# Patient Record
Sex: Female | Born: 1980
Health system: Southern US, Community
[De-identification: ages and names within clinical notes are randomized; demographics above are authoritative.]

## PROBLEM LIST (undated history)

## (undated) DIAGNOSIS — O139 Gestational [pregnancy-induced] hypertension without significant proteinuria, unspecified trimester: Secondary | ICD-10-CM

## (undated) HISTORY — PX: INGUINAL HERNIA REPAIR: SUR1180

---

## 2002-11-17 ENCOUNTER — Encounter: Admission: RE | Admit: 2002-11-17 | Discharge: 2002-11-17 | Payer: Self-pay | Admitting: Family Medicine

## 2002-11-17 ENCOUNTER — Encounter: Payer: Self-pay | Admitting: Family Medicine

## 2007-01-31 ENCOUNTER — Emergency Department (HOSPITAL_COMMUNITY): Admission: EM | Admit: 2007-01-31 | Discharge: 2007-01-31 | Payer: Self-pay | Admitting: Emergency Medicine

## 2007-02-05 ENCOUNTER — Ambulatory Visit (HOSPITAL_COMMUNITY): Admission: RE | Admit: 2007-02-05 | Discharge: 2007-02-05 | Payer: Self-pay | Admitting: Obstetrics & Gynecology

## 2007-02-24 ENCOUNTER — Ambulatory Visit (HOSPITAL_COMMUNITY): Admission: RE | Admit: 2007-02-24 | Discharge: 2007-02-24 | Payer: Self-pay | Admitting: Obstetrics & Gynecology

## 2007-06-02 ENCOUNTER — Ambulatory Visit (HOSPITAL_COMMUNITY): Admission: RE | Admit: 2007-06-02 | Discharge: 2007-06-02 | Payer: Self-pay | Admitting: Obstetrics and Gynecology

## 2007-07-20 ENCOUNTER — Inpatient Hospital Stay (HOSPITAL_COMMUNITY): Admission: AD | Admit: 2007-07-20 | Discharge: 2007-07-20 | Payer: Self-pay | Admitting: Obstetrics & Gynecology

## 2007-07-23 ENCOUNTER — Inpatient Hospital Stay (HOSPITAL_COMMUNITY): Admission: AD | Admit: 2007-07-23 | Discharge: 2007-07-27 | Payer: Self-pay | Admitting: Obstetrics and Gynecology

## 2007-07-24 ENCOUNTER — Encounter (INDEPENDENT_AMBULATORY_CARE_PROVIDER_SITE_OTHER): Payer: Self-pay | Admitting: Obstetrics and Gynecology

## 2007-07-28 ENCOUNTER — Encounter: Admission: RE | Admit: 2007-07-28 | Discharge: 2007-07-29 | Payer: Self-pay | Admitting: Obstetrics and Gynecology

## 2011-03-26 NOTE — Op Note (Signed)
[name], [name]                ACCOUNT NO.:  [id number]   MEDICAL RECORD NO.:  [id number]          PATIENT TYPE:  INP   LOCATION:  9106                          FACILITY:  [facility]   PHYSICIAN:  Sherron Monday, MD        DATE OF BIRTH:  [birth year]   DATE OF PROCEDURE:  07/24/2007  DATE OF DISCHARGE:                               OPERATIVE REPORT   PREOPERATIVE DIAGNOSIS:  Intrauterine pregnancy at term, pregnancy  induced hypertension, fetal intolerance of labor, likely intrauterine  growth retardation.   POSTOPERATIVE DIAGNOSIS:  Intrauterine pregnancy at term, pregnancy  induced hypertension, fetal intolerance of labor, likely intrauterine  growth retardation,  delivered.   PROCEDURE:  Primary low transverse cesarean section.   ANESTHESIA:  Spinal.   SURGEON:  Sherron Monday, MD.   FINDINGS:  Viable female infant, and 6:07 a.m. with Apgar's of 9 at 1  minute and 9 at 5 minutes and a weight of 5 pounds 1 ounce normal  uterus, tubes and ovaries.   ESTIMATED BLOOD LOSS:  600 mL.   IV FLUIDS:  2400 mL.   URINE OUTPUT:  250 mL clear urine at the end of the procedure.   COMPLICATIONS:  None.  Placenta to cord blood collection and then  pathology.   DISPOSITION FOLLOWING PROCEDURE:  Stable to the OR.   DESCRIPTION OF PROCEDURE:  After informed consent was reviewed with the  patient including risks, benefits and alternatives of the surgical  procedure including but not limited to bleeding, infection, damage to  the surrounding organs, bowels, bladder, ureters, blood vessels, etc. as  well as trouble healing, she was transported to the OR in stable  condition.  Spinal anesthesia was induced and found to be adequate.  She  was then prepped and draped in the normal sterile fashion and a Foley  catheter was sterilely placed. A Pfannenstiel skin incision was made  approximately 2 fingerbreadths above the pubic symphysis and carried  through to the underlying layer of fascia sharply. The  fascia was  incised in the midline and the incision was extended laterally with Mayo  scissors.   The inferior aspect of the fascial incision was grasped with Kocher  clamps, elevated and the rectus muscles were dissected off both bluntly  and sharply.  Attention was then turned to the superior aspect of the  fascial incision which was grasped with clamps, elevated and the rectus  muscles were dissected off both bluntly and sharply and the midline was  easily identified and the peritoneum was entered bluntly.  The entry was  extended with direct visualization. The Alexis wound retractor was  placed after checking that bowels were not entrapped. The vesicouterine  peritoneum was easily identified and elevated with smooth pick-ups,  bladder flap was created with Metzenbaum scissors and blunt dissection.  The uterus was incised in a transverse fashion and this incision was  extended with bandage scissors.  The infant was delivered from a vertex  presentation.  Nose and mouth were suctioned on the field, cord was  clamped and cut, infant was handed off to the  waiting NICU staff.  The  uterus was cleared of all clots and debris.  The uterine incision was  closed in two layers, the first of which was a running locked, the  second is an imbricating layer. This was found to be hemostatic.  The  peritoneal cavity was irrigated and the uterus was replaced. The fascia  was closed with #0 Vicryl in a running locked fashion after the  subfascial planes had been inspected and found to be hemostatic.  The  subcuticular adipose layer was made hemostatic with Bovie cautery and  irrigated. Three interrupted's of plain gut were placed and the skin was  closed with staples.  The patient tolerated the procedure well.  Sponge,  lap and needle count was correct x2 at the end of the procedure per the  operating room staff.      Sherron Monday, MD  Electronically Signed     JB/MEDQ  D:  07/24/2007  T:   07/24/2007  Job:  [id number]

## 2011-03-26 NOTE — Discharge Summary (Signed)
[name], [name]                ACCOUNT NO.:  [id number]   MEDICAL RECORD NO.:  [id number]          PATIENT TYPE:  INP   LOCATION:  9106                          FACILITY:  [facility]   PHYSICIAN:  Sherron Monday, MD        DATE OF BIRTH:  11/12/1980   DATE OF ADMISSION:  07/23/2007  DATE OF DISCHARGE:  07/27/2007                               DISCHARGE SUMMARY   ADMISSION DIAGNOSIS:  Intrauterine pregnancy at term, nonreassuring NST  at the office with positive contraction stress test, for induction of  labor.   DISCHARGE DIAGNOSIS:  Intrauterine pregnancy at term, nonreassuring NST  at the office with positive contraction stress test, for induction of  labor, delivered via low transverse cesarean section.   HISTORY OF PRESENT ILLNESS:  A 30 year old, G2, P0-0-1-0, at 39 weeks  plus 5 weeks with pregnancy-induced hypertension, for contraction stress  test secondary to a nonreassuring NST in the office with a late/variable  decel.  Patient with negative CST initially, then spontaneous positive  CST, for induction of labor secondary to unfavorable cervix which was  fingertip, 20 and minus 3.  The choice was made to induce her with  Cervidil.  She stated she had good fetal movement, no loss of fluid, no  vaginal bleeding, occasional contraction.  She has lost her mucous plug.  Prenatal care was complicated by transfer of care, as well as a  chlamydial infection; right upper quadrant pain with negative right  upper quadrant ultrasound; ultrasound recently for size less than dates;  and a 24-hour urine July 22, 2007, which was negative for  preeclampsia and her PIH labs were negative.   PAST MEDICAL HISTORY:  Not significant.   PAST SURGICAL HISTORY:  Significant for hernia repair as an infant.   PAST OB/GYN HISTORY:  G1 was a therapeutic abortion.  G2 is the present  pregnancy.  History of an abnormal Pap smear, but they have been normal  since that.  History of Chlamydia times 2, as  well as Trichomonas.   MEDICATIONS:  Include prenatal vitamins.   ALLERGIES:  No known drug allergies.   SOCIAL HISTORY:  She has a history of tobacco use, however, in the  pregnancy, no alcohol, tobacco or drug use.  She is in  a serious  relationship.   FAMILY HISTORY:  Significant for chronic hypertension in mother, father  and paternal grandmother; diabetes in maternal grandmother and paternal  grandmother; and thyroid disease in maternal grandmother.   PRENATAL LABORATORIES:  Gonorrhea negative.  Chlamydia positive with a  test of cure that was negative.  Negative group B strep. Hemoglobin  11.3, platelets 222,000.  A positive.  Antibody screen negative.  RPR  nonreactive.  Hepatitis B surface antigen negative.  Rubella immune.  Normal hemoglobin electrophoresis.  Varicella immune.  One-hour GTT of  95.  Ultrasound performed on February 24, 2007, gave an [facility] of September  13, revealing normal anatomy, right lateral placenta -- this was  performed at 15 weeks.  Repeat ultrasound on the 12th of August for size  less than dates confirmed the  EDC of July 25, 2007, giving an  estimated fetal weight of 4 pounds 13 ounces, normal AFI, vertex and  normal anatomy.   PHYSICAL EXAMINATION:  VITAL SIGNS:  On admission, blood pressure was  141 to 152/ 89 to 97.  GENERAL:  In no apparent distress.  CARDIOVASCULAR:  Regular rate and rhythm.  LUNGS:  Clear to auscultation bilaterally.  ABDOMEN:  Soft, with a nontender fundus.  EXTREMITIES:  Symmetric and nontender.  PELVIC:  Vaginal exam fingertip, 20 and minus 3.  Fetal heart tones  150s, reactive, with occasional decel, overall reassuring with irregular  tocometry.   HOSPITAL COURSE:  The decision was made to proceed with induction of  labor with Cervidil secondary to her positive contraction stress test.  Throughout the night she was monitored.  Cervidil was placed at 1946 and  heart tones at approximately 10 o'clock in the evening  were 140s and  150s and reactive with no contraction.  The patient complained of  occasional cramping.  At 4:50 in the morning on 11 September after she  had had runs of spontaneous and late decelerations, discussed with the  patient the risks, benefits and alternatives of continuing the induction  of labor with fetal intolerance of labor and a likely small infant, with  pregnancy-induced hypertension and fetal growth restriction.  We  discussed the risks, benefits and alternatives of the cesarean section,  including bleeding, infection, damage to surrounding organs, including  but not limited to bowels, bladder, ureters and blood vessels, also,  trouble healing.  Questions from the patient and the father of the baby  were answered; and the decision was made to proceed with a low  transverse cesarean section.  This went without complication.  EBL of  approximately 600 cc.  Viable female infant was delivered at 6:07 on 12  September with Apgars of 9 and 9 and weight of 5 pounds 1 ounce.  Normal  uterus, tubes and ovaries were noted.  Her postoperative course was  relatively uncomplicated.  She remained afebrile throughout.  Her  hemoglobin decreased from 11.9 to 9.1.  She was able to ambulate, had  normal lochia, pain controlled.  She was passing gas and tolerating a  regular diet on day of discharge.  She was discharged to home with  routine discharge instructions and numbers to call with any questions or  problems.  She will follow up in our office in approximately 2 weeks for  an incision check.  She will be discharged with a prescription for  Motrin, Vicodin and prenatal vitamins.   DISCHARGE INFORMATION:  She is breast and bottle feeding.  A positive.  Rubella immune.  She would like to use the IUD for contraception and  this will be placed at her postpartum checkup.  Her hemoglobin decreased  from 11.9 to 9.1.      Sherron Monday, MD  Electronically Signed     JB/MEDQ  D:   07/27/2007  T:  07/27/2007  Job:  [id number]

## 2011-08-23 LAB — CBC
HCT: 26 — ABNORMAL LOW
HCT: 34 — ABNORMAL LOW
Hemoglobin: 11.9 — ABNORMAL LOW
Hemoglobin: 9.1 — ABNORMAL LOW
MCHC: 35.1
MCV: 89.7
RBC: 3.79 — ABNORMAL LOW
RDW: 13.5
WBC: 9.9

## 2012-06-12 ENCOUNTER — Encounter (HOSPITAL_COMMUNITY): Payer: Self-pay

## 2012-06-12 ENCOUNTER — Inpatient Hospital Stay (HOSPITAL_COMMUNITY): Payer: BC Managed Care – PPO

## 2012-06-12 ENCOUNTER — Inpatient Hospital Stay (HOSPITAL_COMMUNITY)
Admission: AD | Admit: 2012-06-12 | Discharge: 2012-06-12 | Disposition: A | Discharge status: Home / Self Care | Payer: BC Managed Care – PPO | Source: Ambulatory Visit | Attending: Obstetrics and Gynecology | Admitting: Obstetrics and Gynecology

## 2012-06-12 DIAGNOSIS — O200 Threatened abortion: Secondary | ICD-10-CM | POA: Insufficient documentation

## 2012-06-12 DIAGNOSIS — O039 Complete or unspecified spontaneous abortion without complication: Secondary | ICD-10-CM

## 2012-06-12 HISTORY — DX: Gestational (pregnancy-induced) hypertension without significant proteinuria, unspecified trimester: O13.9

## 2012-06-12 LAB — URINE MICROSCOPIC-ADD ON

## 2012-06-12 LAB — URINALYSIS, ROUTINE W REFLEX MICROSCOPIC
Bilirubin Urine: NEGATIVE
Specific Gravity, Urine: >1.03 — ABNORMAL HIGH (ref 1.005–1.030)
Urobilinogen, UA: 0.2 mg/dL (ref 0.0–1.0)

## 2012-06-12 LAB — HCG, QUANTITATIVE, PREGNANCY: hCG, Beta Chain, Quant, S: 10 m[IU]/mL — ABNORMAL HIGH (ref <5)

## 2012-06-12 NOTE — MAU Provider Note (Signed)
  History     CSN: [id number]  Arrival date and time: 06/12/12 0757   First Provider Initiated Contact with Patient 06/12/12 0932      Chief Complaint  Patient presents with  . Vaginal Bleeding  . Abdominal Pain   HPI [name] 31 y.o. [redacted]w[redacted]d   Comes to MAU with vaginal bleeding today.  No pain at present.   OB History    Grav Para Term Preterm Abortions TAB SAB Ect Mult Living   3 1 1  1 1    1       Past Medical History  Diagnosis Date  . Gestational hypertension     Past Surgical History  Procedure Date  . Cesarean section   . Inguinal hernia repair     Family History  Problem Relation Age of Onset  . Other Neg Hx     History  Substance Use Topics  . Smoking status: Never Smoker   . Smokeless tobacco: Not on file  . Alcohol Use: No    Allergies: No Known Allergies  Prescriptions prior to admission  Medication Sig Dispense Refill  . acetaminophen (TYLENOL) 500 MG tablet Take 500 mg by mouth every 6 (six) hours as needed. For headache.      . folic acid (FOLVITE) 800 MCG tablet Take 400 mcg by mouth daily.      . Prenatal Vit-Fe Fumarate-FA (PRENATAL MULTIVITAMIN) TABS Take 1 tablet by mouth daily.        Review of Systems  Gastrointestinal: Negative for nausea, vomiting and abdominal pain.  Genitourinary:       Vaginal bleeding   Physical Exam   Blood pressure 142/95, pulse 70, temperature 98.6 F (37 C), temperature source Oral, resp. rate 16, height 5' 4.5" (1.638 m), weight 184 lb 9.6 oz (83.734 kg), last menstrual period 04/29/2012, SpO2 98.00%.  Physical Exam  Nursing note and vitals reviewed. Constitutional: She is oriented to person, place, and time. She appears well-developed and well-nourished.  HENT:  Head: Normocephalic.  Eyes: EOM are normal.  Neck: Neck supple.  Musculoskeletal: Normal range of motion.  Neurological: She is alert and oriented to person, place, and time.  Skin: Skin is warm and dry.  Psychiatric: She has a  normal mood and affect.    MAU Course  Procedures Results for orders placed during the hospital encounter of 06/12/12 (from the past 24 hour(s))  ABO/RH     Status: Normal (Preliminary result)   Collection Time   06/12/12  9:30 AM      Component Value Range   ABO/RH(D) A POS    HCG, QUANTITATIVE, PREGNANCY     Status: Abnormal   Collection Time   06/12/12  9:30 AM      Component Value Range   hCG, Beta Chain, Quant, S 10 (*) <5 mIU/mL   MDM   1120 - Consult with Dr. Ellyn Hack re: plan of care  Assessment and Plan  Probable miscarriage  Plan Follow up in the office in one week for repeat blood work.  [name] 06/12/2012, 11:19 AM

## 2012-06-12 NOTE — MAU Note (Signed)
Patient states she had brown spotting a couple of days ago. This morning had bright red bleeding with a little cramping. Patient has on a pad with a small amount of dark red blood.

## 2012-06-12 NOTE — ED Notes (Signed)
T.Burleson,NP  Discussed results. And miscarriage with pt and  Husband. Pt crying and upset. Allowed her to verbalized feelings. Husband supportive. Heart strings info and memory pillow given.

## 2012-11-18 ENCOUNTER — Other Ambulatory Visit (HOSPITAL_COMMUNITY): Payer: Self-pay | Admitting: Obstetrics and Gynecology

## 2012-11-18 ENCOUNTER — Encounter (HOSPITAL_COMMUNITY): Payer: Self-pay

## 2012-11-18 ENCOUNTER — Ambulatory Visit (HOSPITAL_COMMUNITY)
Admission: RE | Admit: 2012-11-18 | Discharge: 2012-11-18 | Disposition: A | Discharge status: Home / Self Care | Payer: BC Managed Care – PPO | Source: Ambulatory Visit | Attending: Obstetrics and Gynecology | Admitting: Obstetrics and Gynecology

## 2012-11-18 DIAGNOSIS — Z3689 Encounter for other specified antenatal screening: Secondary | ICD-10-CM

## 2012-11-18 DIAGNOSIS — O283 Abnormal ultrasonic finding on antenatal screening of mother: Secondary | ICD-10-CM

## 2012-11-18 DIAGNOSIS — O358XX0 Maternal care for other (suspected) fetal abnormality and damage, not applicable or unspecified: Secondary | ICD-10-CM | POA: Insufficient documentation

## 2012-11-18 DIAGNOSIS — O34219 Maternal care for unspecified type scar from previous cesarean delivery: Secondary | ICD-10-CM | POA: Insufficient documentation

## 2012-11-18 DIAGNOSIS — O09299 Supervision of pregnancy with other poor reproductive or obstetric history, unspecified trimester: Secondary | ICD-10-CM | POA: Insufficient documentation

## 2012-11-18 DIAGNOSIS — Z1389 Encounter for screening for other disorder: Secondary | ICD-10-CM | POA: Insufficient documentation

## 2012-11-18 DIAGNOSIS — Z363 Encounter for antenatal screening for malformations: Secondary | ICD-10-CM | POA: Insufficient documentation

## 2012-11-19 ENCOUNTER — Other Ambulatory Visit (HOSPITAL_COMMUNITY): Payer: BC Managed Care – PPO

## 2012-11-20 ENCOUNTER — Other Ambulatory Visit: Payer: Self-pay

## 2013-01-07 ENCOUNTER — Other Ambulatory Visit (HOSPITAL_COMMUNITY): Payer: Self-pay | Admitting: Obstetrics and Gynecology

## 2013-01-07 DIAGNOSIS — O34219 Maternal care for unspecified type scar from previous cesarean delivery: Secondary | ICD-10-CM

## 2013-01-07 DIAGNOSIS — O358XX0 Maternal care for other (suspected) fetal abnormality and damage, not applicable or unspecified: Secondary | ICD-10-CM

## 2013-01-07 DIAGNOSIS — O09299 Supervision of pregnancy with other poor reproductive or obstetric history, unspecified trimester: Secondary | ICD-10-CM

## 2013-01-14 ENCOUNTER — Ambulatory Visit (HOSPITAL_COMMUNITY)
Admission: RE | Admit: 2013-01-14 | Discharge: 2013-01-14 | Disposition: A | Discharge status: Home / Self Care | Payer: BC Managed Care – PPO | Source: Ambulatory Visit | Attending: Obstetrics and Gynecology | Admitting: Obstetrics and Gynecology

## 2013-01-14 DIAGNOSIS — O09299 Supervision of pregnancy with other poor reproductive or obstetric history, unspecified trimester: Secondary | ICD-10-CM | POA: Insufficient documentation

## 2013-01-14 DIAGNOSIS — O34219 Maternal care for unspecified type scar from previous cesarean delivery: Secondary | ICD-10-CM | POA: Insufficient documentation

## 2013-01-14 DIAGNOSIS — O358XX0 Maternal care for other (suspected) fetal abnormality and damage, not applicable or unspecified: Secondary | ICD-10-CM | POA: Insufficient documentation

## 2013-02-09 ENCOUNTER — Other Ambulatory Visit (HOSPITAL_COMMUNITY): Payer: Self-pay | Admitting: Obstetrics and Gynecology

## 2013-02-09 DIAGNOSIS — O09299 Supervision of pregnancy with other poor reproductive or obstetric history, unspecified trimester: Secondary | ICD-10-CM

## 2013-02-09 DIAGNOSIS — O34219 Maternal care for unspecified type scar from previous cesarean delivery: Secondary | ICD-10-CM

## 2013-02-09 DIAGNOSIS — O358XX0 Maternal care for other (suspected) fetal abnormality and damage, not applicable or unspecified: Secondary | ICD-10-CM

## 2013-02-11 ENCOUNTER — Encounter (HOSPITAL_COMMUNITY): Payer: Self-pay

## 2013-02-11 ENCOUNTER — Ambulatory Visit (HOSPITAL_COMMUNITY)
Admission: RE | Admit: 2013-02-11 | Discharge: 2013-02-11 | Disposition: A | Discharge status: Home / Self Care | Payer: BC Managed Care – PPO | Source: Ambulatory Visit | Attending: Obstetrics and Gynecology | Admitting: Obstetrics and Gynecology

## 2013-02-11 VITALS — BP 132/80 | HR 78 | Wt 221.0 lb

## 2013-02-11 DIAGNOSIS — O358XX0 Maternal care for other (suspected) fetal abnormality and damage, not applicable or unspecified: Secondary | ICD-10-CM

## 2013-02-11 DIAGNOSIS — O34219 Maternal care for unspecified type scar from previous cesarean delivery: Secondary | ICD-10-CM | POA: Insufficient documentation

## 2013-02-11 DIAGNOSIS — O09299 Supervision of pregnancy with other poor reproductive or obstetric history, unspecified trimester: Secondary | ICD-10-CM

## 2013-03-11 ENCOUNTER — Encounter (HOSPITAL_COMMUNITY): Payer: Self-pay

## 2013-03-11 ENCOUNTER — Ambulatory Visit (HOSPITAL_COMMUNITY)
Admission: RE | Admit: 2013-03-11 | Discharge: 2013-03-11 | Disposition: A | Discharge status: Home / Self Care | Payer: BC Managed Care – PPO | Source: Ambulatory Visit | Attending: Obstetrics and Gynecology | Admitting: Obstetrics and Gynecology

## 2013-03-11 DIAGNOSIS — O358XX0 Maternal care for other (suspected) fetal abnormality and damage, not applicable or unspecified: Secondary | ICD-10-CM

## 2013-03-11 DIAGNOSIS — O09299 Supervision of pregnancy with other poor reproductive or obstetric history, unspecified trimester: Secondary | ICD-10-CM | POA: Insufficient documentation

## 2013-03-11 DIAGNOSIS — O34219 Maternal care for unspecified type scar from previous cesarean delivery: Secondary | ICD-10-CM | POA: Insufficient documentation

## 2013-09-14 ENCOUNTER — Other Ambulatory Visit: Payer: Self-pay

## 2013-09-23 ENCOUNTER — Encounter (HOSPITAL_COMMUNITY): Payer: Self-pay | Admitting: *Deleted

## 2013-11-19 ENCOUNTER — Other Ambulatory Visit: Payer: Self-pay | Admitting: Nurse Practitioner

## 2013-11-19 DIAGNOSIS — R51 Headache: Principal | ICD-10-CM

## 2013-11-19 DIAGNOSIS — R42 Dizziness and giddiness: Secondary | ICD-10-CM

## 2013-11-19 DIAGNOSIS — R519 Headache, unspecified: Secondary | ICD-10-CM

## 2013-11-30 ENCOUNTER — Other Ambulatory Visit: Payer: BC Managed Care – PPO

## 2013-12-03 ENCOUNTER — Ambulatory Visit
Admission: RE | Admit: 2013-12-03 | Discharge: 2013-12-03 | Disposition: A | Discharge status: Home / Self Care | Payer: BC Managed Care – PPO | Source: Ambulatory Visit | Attending: Nurse Practitioner | Admitting: Nurse Practitioner

## 2013-12-03 ENCOUNTER — Other Ambulatory Visit: Payer: Self-pay | Admitting: Nurse Practitioner

## 2013-12-03 DIAGNOSIS — R519 Headache, unspecified: Secondary | ICD-10-CM

## 2013-12-03 DIAGNOSIS — R51 Headache: Principal | ICD-10-CM

## 2013-12-03 DIAGNOSIS — R42 Dizziness and giddiness: Secondary | ICD-10-CM

## 2014-09-12 ENCOUNTER — Encounter (HOSPITAL_COMMUNITY): Payer: Self-pay | Admitting: *Deleted

## 2016-04-30 DIAGNOSIS — Z30431 Encounter for routine checking of intrauterine contraceptive device: Secondary | ICD-10-CM | POA: Diagnosis not present

## 2016-04-30 DIAGNOSIS — Z6832 Body mass index (BMI) 32.0-32.9, adult: Secondary | ICD-10-CM | POA: Diagnosis not present

## 2016-04-30 DIAGNOSIS — Z1389 Encounter for screening for other disorder: Secondary | ICD-10-CM | POA: Diagnosis not present

## 2016-04-30 DIAGNOSIS — Z01419 Encounter for gynecological examination (general) (routine) without abnormal findings: Secondary | ICD-10-CM | POA: Diagnosis not present

## 2016-04-30 DIAGNOSIS — Z113 Encounter for screening for infections with a predominantly sexual mode of transmission: Secondary | ICD-10-CM | POA: Diagnosis not present

## 2017-02-24 DIAGNOSIS — J309 Allergic rhinitis, unspecified: Secondary | ICD-10-CM | POA: Diagnosis not present

## 2017-02-24 DIAGNOSIS — R51 Headache: Secondary | ICD-10-CM | POA: Diagnosis not present

## 2017-05-08 DIAGNOSIS — Z0000 Encounter for general adult medical examination without abnormal findings: Secondary | ICD-10-CM | POA: Diagnosis not present

## 2017-05-08 DIAGNOSIS — R12 Heartburn: Secondary | ICD-10-CM | POA: Diagnosis not present

## 2017-05-08 DIAGNOSIS — R5383 Other fatigue: Secondary | ICD-10-CM | POA: Diagnosis not present

## 2017-05-08 DIAGNOSIS — E559 Vitamin D deficiency, unspecified: Secondary | ICD-10-CM | POA: Diagnosis not present

## 2017-06-20 DIAGNOSIS — Z713 Dietary counseling and surveillance: Secondary | ICD-10-CM | POA: Diagnosis not present

## 2017-07-31 DIAGNOSIS — Z1389 Encounter for screening for other disorder: Secondary | ICD-10-CM | POA: Diagnosis not present

## 2017-07-31 DIAGNOSIS — Z130 Encounter for screening for diseases of the blood and blood-forming organs and certain disorders involving the immune mechanism: Secondary | ICD-10-CM | POA: Diagnosis not present

## 2017-07-31 DIAGNOSIS — Z124 Encounter for screening for malignant neoplasm of cervix: Secondary | ICD-10-CM | POA: Diagnosis not present

## 2017-07-31 DIAGNOSIS — N898 Other specified noninflammatory disorders of vagina: Secondary | ICD-10-CM | POA: Diagnosis not present

## 2017-07-31 DIAGNOSIS — Z3202 Encounter for pregnancy test, result negative: Secondary | ICD-10-CM | POA: Diagnosis not present

## 2017-07-31 DIAGNOSIS — Z30431 Encounter for routine checking of intrauterine contraceptive device: Secondary | ICD-10-CM | POA: Diagnosis not present

## 2017-07-31 DIAGNOSIS — Z113 Encounter for screening for infections with a predominantly sexual mode of transmission: Secondary | ICD-10-CM | POA: Diagnosis not present

## 2017-07-31 DIAGNOSIS — Z01419 Encounter for gynecological examination (general) (routine) without abnormal findings: Secondary | ICD-10-CM | POA: Diagnosis not present

## 2017-07-31 DIAGNOSIS — Z6832 Body mass index (BMI) 32.0-32.9, adult: Secondary | ICD-10-CM | POA: Diagnosis not present

## 2017-08-01 DIAGNOSIS — Z124 Encounter for screening for malignant neoplasm of cervix: Secondary | ICD-10-CM | POA: Diagnosis not present

## 2017-08-04 DIAGNOSIS — Z713 Dietary counseling and surveillance: Secondary | ICD-10-CM | POA: Diagnosis not present

## 2017-09-08 DIAGNOSIS — Z713 Dietary counseling and surveillance: Secondary | ICD-10-CM | POA: Diagnosis not present

## 2017-09-24 DIAGNOSIS — Z713 Dietary counseling and surveillance: Secondary | ICD-10-CM | POA: Diagnosis not present

## 2017-10-06 DIAGNOSIS — Z713 Dietary counseling and surveillance: Secondary | ICD-10-CM | POA: Diagnosis not present

## 2017-12-02 DIAGNOSIS — Z713 Dietary counseling and surveillance: Secondary | ICD-10-CM | POA: Diagnosis not present

## 2017-12-22 DIAGNOSIS — Z713 Dietary counseling and surveillance: Secondary | ICD-10-CM | POA: Diagnosis not present

## 2018-01-19 DIAGNOSIS — Z713 Dietary counseling and surveillance: Secondary | ICD-10-CM | POA: Diagnosis not present

## 2018-02-03 DIAGNOSIS — Z713 Dietary counseling and surveillance: Secondary | ICD-10-CM | POA: Diagnosis not present

## 2018-03-16 DIAGNOSIS — Z713 Dietary counseling and surveillance: Secondary | ICD-10-CM | POA: Diagnosis not present

## 2018-03-24 ENCOUNTER — Ambulatory Visit (HOSPITAL_COMMUNITY)
Admission: EM | Admit: 2018-03-24 | Discharge: 2018-03-24 | Disposition: A | Discharge status: Home / Self Care | Payer: BLUE CROSS/BLUE SHIELD | Attending: Family Medicine | Admitting: Family Medicine

## 2018-03-24 ENCOUNTER — Encounter (HOSPITAL_COMMUNITY): Payer: Self-pay | Admitting: Family Medicine

## 2018-03-24 DIAGNOSIS — J3489 Other specified disorders of nose and nasal sinuses: Secondary | ICD-10-CM

## 2018-03-24 DIAGNOSIS — M546 Pain in thoracic spine: Secondary | ICD-10-CM

## 2018-03-24 MED ORDER — CETIRIZINE HCL 10 MG PO TABS: 10.0000 mg | ORAL_TABLET | Freq: Every day | ORAL | 0 refills | Status: DC

## 2018-03-24 MED ORDER — TRIAMCINOLONE ACETONIDE 55 MCG/ACT NA AERO: 2.0000 | INHALATION_SPRAY | Freq: Every day | NASAL | 12 refills | Status: DC

## 2018-03-24 MED ORDER — PREDNISONE 20 MG PO TABS: 40.0000 mg | ORAL_TABLET | Freq: Every day | ORAL | 0 refills | Status: AC

## 2018-03-24 MED ORDER — MELOXICAM 7.5 MG PO TABS: 7.5000 mg | ORAL_TABLET | Freq: Every day | ORAL | 0 refills | Status: DC

## 2018-03-24 NOTE — ED Provider Notes (Signed)
[facility]    CSN: [id number] Arrival date & time: 03/24/18  1308     History   Chief Complaint Chief Complaint  Patient presents with  . Facial Pain    HPI [name] is a 37 y.o. female.   37 year old female comes in for multiple complaints.   3 day history of sinus pressure and eye watering/itching. Denies rhinorrhea, nasal congestion. Denies eye redness, photophobia, foreign body sensation. Denies cough, sore throat. Denies fever, chills, night sweats. Blurry vision when eye watering. Glasses use without contact lens use. Benadryl and sudafed with mild relief.  1 day history of mid thoracic back pain. States no pain at rest, but exacerbated with movement. Denies injury/trauma. Went to a spin class yesterday. States pain started on the left side, now more midline. Denies urinary symptoms such as frequency, dysuria, hematuria. Denies numbness/tingling, loss of bladder or bowel control.  Aleve with improvement.      Past Medical History:  Diagnosis Date  . Gestational hypertension     There are no active problems to display for this patient.   Past Surgical History:  Procedure Laterality Date  . CESAREAN SECTION    . INGUINAL HERNIA REPAIR      OB History    Gravida  4   Para  1   Term  1   Preterm      AB  2   Living  1     SAB  1   TAB  1   Ectopic      Multiple      Live Births               Home Medications    Prior to Admission medications   Medication Sig Start Date End Date Taking? Authorizing Provider  acetaminophen (TYLENOL) 500 MG tablet Take 500 mg by mouth every 6 (six) hours as needed. For headache.    [provider]  cetirizine (ZYRTEC) 10 MG tablet Take 1 tablet (10 mg total) by mouth daily. 03/24/18   Belinda Fisher, PA-C  folic acid (FOLVITE) 800 MCG tablet Take 400 mcg by mouth daily.    [provider]  meloxicam (MOBIC) 7.5 MG tablet Take 1 tablet (7.5 mg total) by mouth daily. 03/24/18    Cathie Hoops, Annarae Macnair V, PA-C  predniSONE (DELTASONE) 20 MG tablet Take 2 tablets (40 mg total) by mouth daily for 5 days. 03/24/18 03/29/18  Belinda Fisher, PA-C  Prenatal Vit-Fe Fumarate-FA (PRENATAL MULTIVITAMIN) TABS Take 1 tablet by mouth daily.    [provider]  triamcinolone (NASACORT) 55 MCG/ACT AERO nasal inhaler Place 2 sprays into the nose daily. 03/24/18   Belinda Fisher, PA-C    Family History Family History  Problem Relation Age of Onset  . Other Neg Hx     Social History Social History   Tobacco Use  . Smoking status: Never Smoker  Substance Use Topics  . Alcohol use: No  . Drug use: No     Allergies   Patient has no known allergies.   Review of Systems Review of Systems  Reason unable to perform ROS: See HPI as above.     Physical Exam Triage Vital Signs ED Triage Vitals  Enc Vitals Group     BP 03/24/18 1327 (!) 148/95     Pulse Rate 03/24/18 1327 65     Resp 03/24/18 1327 16     Temp 03/24/18 1327 98 F (36.7 C)  Temp Source 03/24/18 1327 Oral     SpO2 03/24/18 1327 99 %     Weight 03/24/18 1332 183 lb (83 kg)     Height --      Head Circumference --      Peak Flow --      Pain Score 03/24/18 1331 6     Pain Loc --      Pain Edu? --      Excl. in GC? --    No data found.  Updated Vital Signs BP (!) 148/95 (BP Location: Right Arm)   Pulse 65   Temp 98 F (36.7 C) (Oral)   Resp 16   Wt 183 lb (83 kg)   SpO2 99%   BMI 30.93 kg/m   Physical Exam  Constitutional: She is oriented to person, place, and time. She appears well-developed and well-nourished. No distress.  HENT:  Head: Normocephalic and atraumatic.  Right Ear: Tympanic membrane, external ear and ear canal normal. Tympanic membrane is not erythematous and not bulging.  Left Ear: Tympanic membrane, external ear and ear canal normal. Tympanic membrane is not erythematous and not bulging.  Nose: Mucosal edema present. Right sinus exhibits maxillary sinus tenderness and frontal sinus  tenderness. Left sinus exhibits maxillary sinus tenderness and frontal sinus tenderness.  Mouth/Throat: Uvula is midline, oropharynx is clear and moist and mucous membranes are normal.  Eyes: Pupils are equal, round, and reactive to light. Conjunctivae, EOM and lids are normal.  Neck: Normal range of motion. Neck supple.  Cardiovascular: Normal rate, regular rhythm and normal heart sounds. Exam reveals no gallop and no friction rub.  No murmur heard. Pulmonary/Chest: Effort normal and breath sounds normal. No stridor. No respiratory distress. She has no decreased breath sounds. She has no wheezes. She has no rhonchi. She has no rales.  Musculoskeletal:  No tenderness on palpation of the spinous processes. No tenderness on palpation of bilateral back. Full range of motion of back and shoulder, which exacerbates pain. Strength normal and equal bilaterally. Normal grip strength. Sensation intact and equal bilaterally. Negative straight leg raise.  Radial pulses 2+ and equal bilaterally. Capillary refill less than 2 seconds.   Lymphadenopathy:    She has no cervical adenopathy.  Neurological: She is alert and oriented to person, place, and time.  Skin: Skin is warm and dry. She is not diaphoretic.  Psychiatric: She has a normal mood and affect. Her behavior is normal. Judgment normal.     UC Treatments / Results  Labs (all labs ordered are listed, but only abnormal results are displayed) Labs Reviewed - No data to display  EKG None  Radiology No results found.  Procedures Procedures (including critical care time)  Medications Ordered in UC Medications - No data to display  Initial Impression / Assessment and Plan / UC Course  I have reviewed the triage vital signs and the nursing notes.  Pertinent labs & imaging results that were available during my care of the patient were reviewed by me and considered in my medical decision making (see chart for details).    Symptomatic  treatment for sinus pressure discussed. No signs of conjunctivitis on exam. Lid scrubs and warm compress. Mobic for back pain. Return precautions given. Patient expresses understanding and agrees to plan.  Final Clinical Impressions(s) / UC Diagnoses   Final diagnoses:  Sinus pressure  Acute midline thoracic back pain    ED Prescriptions    Medication Sig Dispense Auth. Provider   triamcinolone (NASACORT) 55  MCG/ACT AERO nasal inhaler Place 2 sprays into the nose daily. 1 Inhaler Toddy Boyd V, PA-C   predniSONE (DELTASONE) 20 MG tablet Take 2 tablets (40 mg total) by mouth daily for 5 days. 10 tablet Wei Poplaski V, PA-C   cetirizine (ZYRTEC) 10 MG tablet Take 1 tablet (10 mg total) by mouth daily. 15 tablet Kymari Lollis V, PA-C   meloxicam (MOBIC) 7.5 MG tablet Take 1 tablet (7.5 mg total) by mouth daily. 15 tablet Threasa Alpha, [place] 03/24/18 1410

## 2018-03-24 NOTE — ED Triage Notes (Signed)
Pt here for sinus pressure and drainage from eye with headache.  sts also also mid upper back pain with movement. sts that she went to the gym yesterday. She is taking sudafed and benadryl. She took aleve for her back.

## 2018-03-24 NOTE — Discharge Instructions (Addendum)
Start nasacort and zyrtec for sinus pressure. Can also take prednisone for sinus pressure. Keep hydrated, your urine should be clear to pale yellow in color. Tylenol/motrin for fever and pain. Monitor for any worsening of symptoms, chest pain, shortness of breath, wheezing, swelling of the throat, follow up for reevaluation.   Lid scrubs and warm compresses as directed. Monitor for any worsening of symptoms, changes in vision, sensitivity to light, eye swelling, painful eye movement, follow up with ophthalmology for further evaluation.    Start mobic as directed. Ice/heat compresses as needed. This can take up to 3-4 weeks to completely resolve, but you should be feeling better each week. Follow up here or with PCP if symptoms worsen, changes for reevaluation. If experience numbness/tingling of the inner thighs, loss of bladder or bowel control, go to the emergency department for evaluation.

## 2018-03-27 ENCOUNTER — Other Ambulatory Visit: Payer: Self-pay

## 2018-03-27 ENCOUNTER — Encounter (HOSPITAL_COMMUNITY): Payer: Self-pay

## 2018-03-27 ENCOUNTER — Ambulatory Visit (HOSPITAL_COMMUNITY)
Admission: EM | Admit: 2018-03-27 | Discharge: 2018-03-27 | Disposition: A | Discharge status: Home / Self Care | Payer: BLUE CROSS/BLUE SHIELD | Attending: Family Medicine | Admitting: Family Medicine

## 2018-03-27 DIAGNOSIS — H04302 Unspecified dacryocystitis of left lacrimal passage: Secondary | ICD-10-CM

## 2018-03-27 MED ORDER — TOBRAMYCIN 0.3 % OP SOLN: 1.0000 [drp] | OPHTHALMIC | 0 refills | Status: DC

## 2018-03-27 NOTE — Discharge Instructions (Signed)
Please use tobrex eye drop every 4 hours

## 2018-03-27 NOTE — ED Triage Notes (Signed)
Pt presents with complaints of problems with her eye, states that she was just here for sinus pressure and it is getting better but her eye feels worse. Pt also states her blood pressure has been high at her last two visits.

## 2018-03-27 NOTE — ED Provider Notes (Signed)
[facility]    CSN: [id number] Arrival date & time: 03/27/18  1205     History   Chief Complaint Chief Complaint  Patient presents with  . Eye Problem    HPI [name] is a 37 y.o. female no contributing past medical history presenting today for evaluation of eye pain.  Patient states that she has had eye pain for the past 5 days which has recently worsened.  She was seen here 3 days ago for sinus pressure and back pain which has improved, but her eye pain has worsened.  She notices pain to her left eye on the medial aspect.  Is noting some drainage and continuous watering of her eye.  Occasional blurriness related to drainage but no persistent changes in vision.  Patient does not wear contacts or glasses.  HPI  Past Medical History:  Diagnosis Date  . Gestational hypertension     There are no active problems to display for this patient.   Past Surgical History:  Procedure Laterality Date  . CESAREAN SECTION    . INGUINAL HERNIA REPAIR      OB History    Gravida  4   Para  1   Term  1   Preterm      AB  2   Living  1     SAB  1   TAB  1   Ectopic      Multiple      Live Births               Home Medications    Prior to Admission medications   Medication Sig Start Date End Date Taking? Authorizing Provider  acetaminophen (TYLENOL) 500 MG tablet Take 500 mg by mouth every 6 (six) hours as needed. For headache.    [provider]  cetirizine (ZYRTEC) 10 MG tablet Take 1 tablet (10 mg total) by mouth daily. 03/24/18   Belinda Fisher, PA-C  folic acid (FOLVITE) 800 MCG tablet Take 400 mcg by mouth daily.    [provider]  meloxicam (MOBIC) 7.5 MG tablet Take 1 tablet (7.5 mg total) by mouth daily. 03/24/18   Cathie Hoops, Amy V, PA-C  predniSONE (DELTASONE) 20 MG tablet Take 2 tablets (40 mg total) by mouth daily for 5 days. 03/24/18 03/29/18  Belinda Fisher, PA-C  Prenatal Vit-Fe Fumarate-FA (PRENATAL MULTIVITAMIN) TABS Take 1  tablet by mouth daily.    [provider]  tobramycin (TOBREX) 0.3 % ophthalmic solution Place 1-2 drops into the left eye every 4 (four) hours. 03/27/18   Sidharth Leverette C, PA-C  triamcinolone (NASACORT) 55 MCG/ACT AERO nasal inhaler Place 2 sprays into the nose daily. 03/24/18   Belinda Fisher, PA-C    Family History Family History  Problem Relation Age of Onset  . Other Neg Hx     Social History Social History   Tobacco Use  . Smoking status: Never Smoker  Substance Use Topics  . Alcohol use: No  . Drug use: No     Allergies   Patient has no known allergies.   Review of Systems Review of Systems  Constitutional: Negative for chills, fatigue and fever.  HENT: Negative for congestion, ear pain, rhinorrhea, sinus pressure, sore throat and trouble swallowing.   Eyes: Positive for pain and discharge. Negative for photophobia, redness and visual disturbance.  Respiratory: Negative for cough, chest tightness and shortness of breath.   Cardiovascular: Negative for chest pain.  Gastrointestinal: Negative for  abdominal pain, nausea and vomiting.  Musculoskeletal: Negative for myalgias.  Skin: Negative for rash.  Neurological: Negative for dizziness, light-headedness and headaches.     Physical Exam Triage Vital Signs ED Triage Vitals [03/27/18 1314]  Enc Vitals Group     BP (!) 165/93     Pulse Rate 90     Resp 18     Temp 98.3 F (36.8 C)     Temp src      SpO2 98 %     Weight      Height      Head Circumference      Peak Flow      Pain Score 5     Pain Loc      Pain Edu?      Excl. in GC?    No data found.  Updated Vital Signs BP (!) 165/93   Pulse 90   Temp 98.3 F (36.8 C)   Resp 18   SpO2 98%   Visual Acuity Right Eye Distance:  20/15 Left Eye Distance:  20/15 Bilateral Distance:  20/15  Right Eye Near:   Left Eye Near:    Bilateral Near:     Physical Exam  Constitutional: She appears well-developed and well-nourished. No distress.    HENT:  Head: Normocephalic and atraumatic.  Eyes: Conjunctivae are normal.  Patient with tenderness to palpation of medial inferior aspect of RA along nasal bridge near lacrimal duct, mild discharge noted on lower lashes, occasionally watery drainage present below the eye.  No conjunctival erythema, no surrounding erythema or swelling.  Neck: Neck supple.  Cardiovascular: Normal rate and regular rhythm.  No murmur heard. Pulmonary/Chest: Effort normal and breath sounds normal. No respiratory distress.  Abdominal: Soft. There is no tenderness.  Musculoskeletal: She exhibits no edema.  Neurological: She is alert.  Skin: Skin is warm and dry.  Psychiatric: She has a normal mood and affect.  Nursing note and vitals reviewed.    UC Treatments / Results  Labs (all labs ordered are listed, but only abnormal results are displayed) Labs Reviewed - No data to display  EKG None  Radiology No results found.  Procedures Procedures (including critical care time)  Medications Ordered in UC Medications - No data to display  Initial Impression / Assessment and Plan / UC Course  I have reviewed the triage vital signs and the nursing notes.  Pertinent labs & imaging results that were available during my care of the patient were reviewed by me and considered in my medical decision making (see chart for details).     Patient with likely dacryocystitis.  Without surrounding cellulitis, will treat with TobraDex eyedrops, continue to monitor.Discussed strict return precautions. Patient verbalized understanding and is agreeable with plan.  Final Clinical Impressions(s) / UC Diagnoses   Final diagnoses:  Dacrocystitis, left     Discharge Instructions     Please use tobrex eye drop every 4 hours   ED Prescriptions    Medication Sig Dispense Auth. Provider   tobramycin (TOBREX) 0.3 % ophthalmic solution Place 1-2 drops into the left eye every 4 (four) hours. 5 mL Joshua Zeringue C,  PA-C     Controlled Substance Prescriptions [place] Controlled Substance Registry consulted? Not Applicable   Lew Dawes, [place] 03/27/18 2239

## 2018-04-03 DIAGNOSIS — R030 Elevated blood-pressure reading, without diagnosis of hypertension: Secondary | ICD-10-CM | POA: Diagnosis not present

## 2018-05-20 DIAGNOSIS — M940 Chondrocostal junction syndrome [Tietze]: Secondary | ICD-10-CM | POA: Diagnosis not present

## 2018-05-20 DIAGNOSIS — R030 Elevated blood-pressure reading, without diagnosis of hypertension: Secondary | ICD-10-CM | POA: Diagnosis not present

## 2018-05-20 DIAGNOSIS — E559 Vitamin D deficiency, unspecified: Secondary | ICD-10-CM | POA: Diagnosis not present

## 2018-05-20 DIAGNOSIS — Z23 Encounter for immunization: Secondary | ICD-10-CM | POA: Diagnosis not present

## 2018-05-20 DIAGNOSIS — Z0000 Encounter for general adult medical examination without abnormal findings: Secondary | ICD-10-CM | POA: Diagnosis not present

## 2018-08-04 DIAGNOSIS — Z3202 Encounter for pregnancy test, result negative: Secondary | ICD-10-CM | POA: Diagnosis not present

## 2018-08-04 DIAGNOSIS — Z30433 Encounter for removal and reinsertion of intrauterine contraceptive device: Secondary | ICD-10-CM | POA: Diagnosis not present

## 2018-08-19 ENCOUNTER — Ambulatory Visit: Payer: BLUE CROSS/BLUE SHIELD | Admitting: Nurse Practitioner

## 2018-08-19 ENCOUNTER — Encounter: Payer: Self-pay | Admitting: Nurse Practitioner

## 2018-08-19 VITALS — BP 124/86 | HR 90 | Temp 98.5°F | Ht 65.0 in | Wt 179.4 lb

## 2018-08-19 DIAGNOSIS — M79675 Pain in left toe(s): Secondary | ICD-10-CM

## 2018-08-19 DIAGNOSIS — W228XXA Striking against or struck by other objects, initial encounter: Secondary | ICD-10-CM | POA: Diagnosis not present

## 2018-08-19 NOTE — Progress Notes (Signed)
  Subjective:     Patient ID: [name] , female    DOB: 06-Nov-1981 , 37 y.o.   MRN: [id number]   Toe Pain   The incident occurred more than 1 week ago (one month ago). Incident location: Hit toe on door - left 5th toe. Injury mechanism: hit on a door. The pain is present in the left toes. The quality of the pain is described as aching. The pain is at a severity of 4/10 (worse in the morning). The pain is mild. The pain has been fluctuating since onset. Pertinent negatives include no inability to bear weight, numbness or tingling. She reports no foreign bodies present. The symptoms are aggravated by movement. She has tried ice and heat for the symptoms. The treatment provided mild relief.     Past Medical History:  Diagnosis Date  . Gestational hypertension       Current Outpatient Medications:  .  hydrochlorothiazide (HYDRODIURIL) 12.5 MG tablet, Take 12.5 mg by mouth daily., Disp: , Rfl:  .  Probiotic Product (PROBIOTIC-10 PO), Take 1 tablet by mouth daily., Disp: , Rfl:  .  triamcinolone (NASACORT) 55 MCG/ACT AERO nasal inhaler, Place 2 sprays into the nose daily., Disp: 1 Inhaler, Rfl: 12 .  acetaminophen (TYLENOL) 500 MG tablet, Take 500 mg by mouth every 6 (six) hours as needed. For headache., Disp: , Rfl:  .  cetirizine (ZYRTEC) 10 MG tablet, Take 1 tablet (10 mg total) by mouth daily. (Patient not taking: Reported on 08/19/2018), Disp: 15 tablet, Rfl: 0 .  folic acid (FOLVITE) 800 MCG tablet, Take 400 mcg by mouth daily., Disp: , Rfl:  .  meloxicam (MOBIC) 7.5 MG tablet, Take 1 tablet (7.5 mg total) by mouth daily. (Patient not taking: Reported on 08/19/2018), Disp: 15 tablet, Rfl: 0 .  Prenatal Vit-Fe Fumarate-FA (PRENATAL MULTIVITAMIN) TABS, Take 1 tablet by mouth daily., Disp: , Rfl:  .  tobramycin (TOBREX) 0.3 % ophthalmic solution, Place 1-2 drops into the left eye every 4 (four) hours. (Patient not taking: Reported on 08/19/2018), Disp: 5 mL, Rfl: 0   Review of Systems   Constitutional: Negative.   Respiratory: Negative.   Cardiovascular: Negative.   Musculoskeletal: Positive for arthralgias.       Had taped toe together with some relief  Skin: Negative.   Neurological: Negative for tingling and numbness.     Today's Vitals   08/19/18 1509  BP: 124/86  Pulse: 90  Temp: 98.5 F (36.9 C)  TempSrc: Oral  SpO2: 90%  Weight: 179 lb 6.4 oz (81.4 kg)  Height: 5\' 5"  (1.651 m)  PainSc: 4   PainLoc: Toe   Body mass index is 29.85 kg/m.   Objective:  Physical Exam  Constitutional: She is oriented to person, place, and time. She appears well-developed and well-nourished.  Neck: Normal range of motion. Neck supple.  Cardiovascular: Normal rate, regular rhythm, normal heart sounds and intact distal pulses.  Neurological: She is alert and oriented to person, place, and time.  Skin: Skin is warm and dry.        Assessment And Plan:     1. Toe pain, left  Likely bruise - will check left foot xray  Encouraged to take ibuprofen twice a day for 3-5 days  Keep foot elevated when possible  Limit exercise until better.   - DG Foot Complete Left     Arnette Felts, FNP

## 2018-08-19 NOTE — Patient Instructions (Addendum)
   Ice to foot 10-15 mins 2 times per day  Take ibuprofen twice a day for 3-5 days.  Go to get foot xray  If fracture will refer to orthopdedic

## 2018-09-16 ENCOUNTER — Other Ambulatory Visit: Payer: Self-pay | Admitting: Nurse Practitioner

## 2018-09-16 ENCOUNTER — Ambulatory Visit
Admission: RE | Admit: 2018-09-16 | Discharge: 2018-09-16 | Disposition: A | Discharge status: Home / Self Care | Payer: BLUE CROSS/BLUE SHIELD | Source: Ambulatory Visit | Attending: Nurse Practitioner | Admitting: Nurse Practitioner

## 2018-09-16 DIAGNOSIS — S92525A Nondisplaced fracture of medial phalanx of left lesser toe(s), initial encounter for closed fracture: Secondary | ICD-10-CM | POA: Diagnosis not present

## 2018-09-16 NOTE — Progress Notes (Signed)
Called patient to inform has a toe fracture, I have advised her to go to [facility] this evening for their urgent care clinic since she continues to have problems with her toe.  Referral done as well

## 2018-09-17 DIAGNOSIS — S92535A Nondisplaced fracture of distal phalanx of left lesser toe(s), initial encounter for closed fracture: Secondary | ICD-10-CM | POA: Diagnosis not present

## 2018-09-21 ENCOUNTER — Encounter: Payer: Self-pay | Admitting: Nurse Practitioner

## 2018-09-21 ENCOUNTER — Ambulatory Visit: Payer: BLUE CROSS/BLUE SHIELD | Admitting: Nurse Practitioner

## 2018-09-21 VITALS — BP 128/88 | HR 60 | Temp 98.8°F | Ht 64.5 in | Wt 179.4 lb

## 2018-09-21 DIAGNOSIS — K219 Gastro-esophageal reflux disease without esophagitis: Secondary | ICD-10-CM

## 2018-09-21 DIAGNOSIS — Z23 Encounter for immunization: Secondary | ICD-10-CM

## 2018-09-21 DIAGNOSIS — I10 Essential (primary) hypertension: Secondary | ICD-10-CM

## 2018-09-21 MED ORDER — OMEPRAZOLE 20 MG PO CPDR: 20.0000 mg | DELAYED_RELEASE_CAPSULE | Freq: Every day | ORAL | 1 refills | Status: DC

## 2018-09-21 NOTE — Patient Instructions (Signed)

## 2018-09-21 NOTE — Progress Notes (Signed)
  Subjective:     Patient ID: [name] , female    DOB: 1981/05/21 , 37 y.o.   MRN: [id number]   Chief Complaint  Patient presents with  . Hypertension    HPI  She is currently wearing a orthotic boot for her nondisplaced toe left foot.  Wearing for 3-4 weeks.   She is also having an uncomfortable feeling in her stomach with increased gas.  Denies actual pain.  Occurring over the last 3-4 months. Feels like does not empty completely.   Hypertension  This is a chronic problem. The current episode started more than 1 year ago. The problem is unchanged. The problem is controlled. Pertinent negatives include no anxiety, malaise/fatigue or shortness of breath. There are no associated agents to hypertension. Past treatments include diuretics. The current treatment provides significant improvement. There are no compliance problems.      Past Medical History:  Diagnosis Date  . Gestational hypertension      Family History  Problem Relation Age of Onset  . Other Neg Hx      Current Outpatient Medications:  .  acetaminophen (TYLENOL) 500 MG tablet, Take 500 mg by mouth every 6 (six) hours as needed. For headache., Disp: , Rfl:  .  hydrochlorothiazide (HYDRODIURIL) 12.5 MG tablet, Take 12.5 mg by mouth daily., Disp: , Rfl:  .  Probiotic Product (PROBIOTIC-10 PO), Take 1 tablet by mouth daily., Disp: , Rfl:  .  triamcinolone (NASACORT) 55 MCG/ACT AERO nasal inhaler, Place 2 sprays into the nose daily., Disp: 1 Inhaler, Rfl: 12   Allergies  Allergen Reactions  . Diflucan [Fluconazole] Other (See Comments)    Skin on her hand burned off.     Review of Systems  Constitutional: Negative for malaise/fatigue.  Respiratory: Negative.  Negative for cough and shortness of breath.   Cardiovascular: Negative.   Gastrointestinal: Positive for constipation. Negative for abdominal pain and nausea.  Genitourinary: Negative.   Musculoskeletal: Negative.   Skin: Negative.      Today's  Vitals   09/21/18 0849  BP: 128/88  Pulse: 60  Temp: 98.8 F (37.1 C)  TempSrc: Oral  SpO2: 98%  Weight: 179 lb 6.4 oz (81.4 kg)  Height: 5' 4.5" (1.638 m)  PainSc: 0-No pain   Body mass index is 30.32 kg/m.   Objective:  Physical Exam  Constitutional: She is oriented to person, place, and time. She appears well-nourished.  Cardiovascular: Normal rate, regular rhythm and normal heart sounds.  Pulmonary/Chest: Effort normal and breath sounds normal.  Abdominal: Soft. She exhibits no distension and no mass. There is no tenderness.  Neurological: She is alert and oriented to person, place, and time.  Psychiatric: She has a normal mood and affect.        Assessment And Plan:     1. Essential hypertension  Chronic,good control  Continue with current medications  2. Need for influenza vaccination  Influenza vaccine administered  Encouraged to take Tylenol as needed for fever or muscle aches. - Flu Vaccine QUAD 6+ mos PF IM (Fluarix Quad PF)  3. Gastroesophageal reflux disease without esophagitis  ACUTE  I have given her a prescription for omeprazole as needed. Advised to take at onset daily and for a few days.   Also given linzess for possible constipation/emptying decreased  - omeprazole (PRILOSEC) 20 MG capsule; Take 1 capsule (20 mg total) by mouth daily.  Dispense: 30 capsule; Refill: 1       Arnette Felts, FNP

## 2018-10-06 DIAGNOSIS — Z130 Encounter for screening for diseases of the blood and blood-forming organs and certain disorders involving the immune mechanism: Secondary | ICD-10-CM | POA: Diagnosis not present

## 2018-10-06 DIAGNOSIS — N898 Other specified noninflammatory disorders of vagina: Secondary | ICD-10-CM | POA: Diagnosis not present

## 2018-10-06 DIAGNOSIS — Z01419 Encounter for gynecological examination (general) (routine) without abnormal findings: Secondary | ICD-10-CM | POA: Diagnosis not present

## 2018-10-06 DIAGNOSIS — Z30431 Encounter for routine checking of intrauterine contraceptive device: Secondary | ICD-10-CM | POA: Diagnosis not present

## 2018-10-12 DIAGNOSIS — S92535D Nondisplaced fracture of distal phalanx of left lesser toe(s), subsequent encounter for fracture with routine healing: Secondary | ICD-10-CM | POA: Diagnosis not present

## 2018-10-15 ENCOUNTER — Other Ambulatory Visit: Payer: Self-pay | Admitting: Nurse Practitioner

## 2018-10-15 DIAGNOSIS — K219 Gastro-esophageal reflux disease without esophagitis: Secondary | ICD-10-CM

## 2018-11-17 ENCOUNTER — Other Ambulatory Visit: Payer: Self-pay | Admitting: Nurse Practitioner

## 2018-11-17 DIAGNOSIS — K219 Gastro-esophageal reflux disease without esophagitis: Secondary | ICD-10-CM

## 2018-12-04 ENCOUNTER — Other Ambulatory Visit: Payer: Self-pay | Admitting: Nurse Practitioner

## 2018-12-04 DIAGNOSIS — K219 Gastro-esophageal reflux disease without esophagitis: Secondary | ICD-10-CM

## 2019-01-11 ENCOUNTER — Ambulatory Visit: Payer: BLUE CROSS/BLUE SHIELD | Admitting: Nurse Practitioner

## 2019-01-11 ENCOUNTER — Encounter: Payer: Self-pay | Admitting: Nurse Practitioner

## 2019-01-11 VITALS — BP 132/80 | HR 60 | Ht 64.5 in | Wt 188.4 lb

## 2019-01-11 DIAGNOSIS — R351 Nocturia: Secondary | ICD-10-CM

## 2019-01-11 DIAGNOSIS — R109 Unspecified abdominal pain: Secondary | ICD-10-CM

## 2019-01-11 DIAGNOSIS — I10 Essential (primary) hypertension: Secondary | ICD-10-CM

## 2019-01-11 NOTE — Progress Notes (Signed)
Subjective:     Patient ID: [name] , female    DOB: Apr 06, 1981 , 38 y.o.   MRN: [id number]   Chief Complaint  Patient presents with  . Hypertension    follow up , having stomach issures, discomfort , sometimes feel nausea     HPI  Hypertension  This is a chronic problem. The current episode started more than 1 month ago. The problem is controlled. Pertinent negatives include no shortness of breath. Risk factors for coronary artery disease include obesity. Past treatments include diuretics. The current treatment provides no improvement. Compliance problems include exercise.  There is no history of angina or kidney disease. There is no history of chronic renal disease.  Gastroesophageal Reflux  She complains of nausea. She reports no abdominal pain, no coughing, no dysphagia or no heartburn. Exacerbated by: sometimes uncomfortable after eating. Pertinent negatives include no anemia or fatigue. Risk factors include obesity. She has tried a histamine-2 antagonist for the symptoms. Past procedures do not include an abdominal ultrasound or H. pylori antibody titer.     Past Medical History:  Diagnosis Date  . Gestational hypertension      Family History  Problem Relation Age of Onset  . Other Neg Hx      Current Outpatient Medications:  .  acetaminophen (TYLENOL) 500 MG tablet, Take 500 mg by mouth every 6 (six) hours as needed. For headache., Disp: , Rfl:  .  hydrochlorothiazide (HYDRODIURIL) 12.5 MG tablet, Take 12.5 mg by mouth daily., Disp: , Rfl:  .  omeprazole (PRILOSEC) 20 MG capsule, TAKE 1 CAPSULE BY MOUTH EVERY DAY, Disp: 90 capsule, Rfl: 1 .  Probiotic Product (PROBIOTIC-10 PO), Take 1 tablet by mouth daily., Disp: , Rfl:  .  triamcinolone (NASACORT) 55 MCG/ACT AERO nasal inhaler, Place 2 sprays into the nose daily., Disp: 1 Inhaler, Rfl: 12   Allergies  Allergen Reactions  . Diflucan [Fluconazole] Other (See Comments)    Skin on her hand burned off.      Review of Systems  Constitutional: Negative for fatigue.  Respiratory: Negative.  Negative for cough and shortness of breath.   Cardiovascular: Negative.   Gastrointestinal: Positive for constipation and nausea. Negative for abdominal pain, dysphagia and heartburn.  Genitourinary: Negative.   Musculoskeletal: Negative.   Skin: Negative.      Today's Vitals   01/11/19 0901  BP: 132/80  Pulse: 60  SpO2: 98%  Weight: 188 lb 6.4 oz (85.5 kg)  Height: 5' 4.5" (1.638 m)   Body mass index is 31.84 kg/m.   Objective:  Physical Exam Constitutional:      General: She is not in acute distress.    Appearance: She is obese.  Cardiovascular:     Rate and Rhythm: Normal rate and regular rhythm.     Pulses: Normal pulses.     Heart sounds: Normal heart sounds. No murmur.  Pulmonary:     Effort: Pulmonary effort is normal. No respiratory distress.     Breath sounds: Normal breath sounds. No wheezing.  Abdominal:     General: Abdomen is flat. Bowel sounds are normal. There is no distension.     Palpations: Abdomen is soft. There is no mass.     Tenderness: There is no abdominal tenderness.  Skin:    General: Skin is warm and dry.  Neurological:     General: No focal deficit present.     Mental Status: She is alert and oriented to person, place, and time.  Psychiatric:  Mood and Affect: Mood normal.        Behavior: Behavior normal.        Thought Content: Thought content normal.        Judgment: Judgment normal.         Assessment And Plan:     1. Nocturia  Will send her for sleep study for further evaluation  Reports going to bathroom at night at least 2-3 times - Ambulatory referral to Sleep Studies  2. Abdominal discomfort  No abdominal tenderness on palpation however this is ongoing and she is not having any relief with prilosec  Will refer to GI for further evaluation - CMP14 + Anion Gap - CBC no Diff - Ambulatory referral to Gastroenterology  3.  Essential hypertension B/P is fair control.  The importance of regular exercise and dietary modification was stressed to the patient.  Stressed importance of losing ten percent of her body weight to help with B/P control.  The weight loss would help with decreasing cardiac and cancer risk as well.  Encouraged to increase water intake - CBC no Diff   Arnette Felts, FNP

## 2019-01-12 LAB — CMP14 + ANION GAP
ALBUMIN: 4.8 g/dL (ref 3.8–4.8)
ALT: 25 IU/L (ref 0–32)
AST: 23 IU/L (ref 0–40)
Albumin/Globulin Ratio: 1.9 (ref 1.2–2.2)
Alkaline Phosphatase: 39 IU/L (ref 39–117)
Anion Gap: 14 mmol/L (ref 10.0–18.0)
BILIRUBIN TOTAL: 0.4 mg/dL (ref 0.0–1.2)
BUN / CREAT RATIO: 12 (ref 9–23)
BUN: 13 mg/dL (ref 6–20)
CHLORIDE: 103 mmol/L (ref 96–106)
CO2: 24 mmol/L (ref 20–29)
CREATININE: 1.08 mg/dL — AB (ref 0.57–1.00)
Calcium: 9.9 mg/dL (ref 8.7–10.2)
GFR calc non Af Amer: 66 mL/min/{1.73_m2} (ref >59)
GFR, EST AFRICAN AMERICAN: 76 mL/min/{1.73_m2} (ref >59)
Globulin, Total: 2.5 g/dL (ref 1.5–4.5)
Glucose: 72 mg/dL (ref 65–99)
Potassium: 3.9 mmol/L (ref 3.5–5.2)
Sodium: 141 mmol/L (ref 134–144)
TOTAL PROTEIN: 7.3 g/dL (ref 6.0–8.5)

## 2019-01-12 LAB — CBC
HEMATOCRIT: 39 % (ref 34.0–46.6)
Hemoglobin: 13.2 g/dL (ref 11.1–15.9)
MCH: 30.8 pg (ref 26.6–33.0)
MCHC: 33.8 g/dL (ref 31.5–35.7)
MCV: 91 fL (ref 79–97)
Platelets: 338 10*3/uL (ref 150–450)
RBC: 4.29 x10E6/uL (ref 3.77–5.28)
RDW: 11.7 % (ref 11.7–15.4)
WBC: 5.4 10*3/uL (ref 3.4–10.8)

## 2019-01-25 ENCOUNTER — Telehealth: Payer: BLUE CROSS/BLUE SHIELD | Admitting: Family

## 2019-01-25 ENCOUNTER — Other Ambulatory Visit: Payer: Self-pay | Admitting: Nurse Practitioner

## 2019-01-25 ENCOUNTER — Encounter: Payer: Self-pay | Admitting: Nurse Practitioner

## 2019-01-25 DIAGNOSIS — R0602 Shortness of breath: Secondary | ICD-10-CM

## 2019-01-25 DIAGNOSIS — R059 Cough, unspecified: Secondary | ICD-10-CM

## 2019-01-25 DIAGNOSIS — R05 Cough: Secondary | ICD-10-CM

## 2019-01-25 MED ORDER — CETIRIZINE HCL 10 MG PO TABS: 10.0000 mg | ORAL_TABLET | Freq: Every day | ORAL | 1 refills | Status: DC

## 2019-01-25 NOTE — Progress Notes (Signed)
E-Visit for [employer] Virus Screening  Based on what you have shared with me, I feel your condition warrants further evaluation for Corona virus and I recommend that you be seen and evaluated "face to face". The [facility] drive thru testing will be open tomorrow, however to order in epic is not up and running at this time.  Approximately 5 minutes was spent documenting and reviewing patient's chart.    Our Emergency Departments are best equipped to handle patients with potential Corona Virus. I recommend the following:  . If you are having a true medical emergency please call 911. . Since you are considered high risk for Corona virus virus because of a known exposure, fever, shortness of breath and cough, OR if you have severe symptoms of any kind, seek medical care at an emergency room.  . Please call ahead and tell them that you were seen by telemedicine and they have recommended that you have a face to face evaluation for Corona virus.  [facility] Emergency Department [address] [place], [place], [place] [address] [phone]  . [facility] Emergency Department [address] [place], [place] [address] [phone]  . [facility] Emergency Department [address] [place], [place], [place] [address] [phone]  . [facility] Emergency Department [address] [place], [place], [place] [address] [phone]  . [facility] Emergency Department [address] [place], [place], [place] [address] [phone]  NOTE: If you entered your credit card information for this eVisit, you will not be charged. You may see a "hold" on your card for the $35 but that hold will drop off and you will not have a charge processed.   Your e-visit answers were reviewed by a board certified advanced clinical practitioner to complete your personal care plan.  Thank you for using e-Visits.

## 2019-01-31 ENCOUNTER — Encounter: Payer: Self-pay | Admitting: Nurse Practitioner

## 2019-02-01 ENCOUNTER — Ambulatory Visit: Payer: BLUE CROSS/BLUE SHIELD | Admitting: Nurse Practitioner

## 2019-02-19 ENCOUNTER — Other Ambulatory Visit: Payer: Self-pay | Admitting: Nurse Practitioner

## 2019-02-19 DIAGNOSIS — K219 Gastro-esophageal reflux disease without esophagitis: Secondary | ICD-10-CM

## 2019-03-30 ENCOUNTER — Encounter: Payer: Self-pay | Admitting: Nurse Practitioner

## 2019-03-30 ENCOUNTER — Telehealth: Payer: Self-pay

## 2019-03-30 NOTE — Telephone Encounter (Signed)
The pt was scheduled for a virtual appointment with her consent.

## 2019-03-31 ENCOUNTER — Ambulatory Visit: Payer: BLUE CROSS/BLUE SHIELD | Admitting: Nurse Practitioner

## 2019-03-31 ENCOUNTER — Other Ambulatory Visit: Payer: Self-pay

## 2019-03-31 ENCOUNTER — Encounter: Payer: Self-pay | Admitting: Nurse Practitioner

## 2019-03-31 VITALS — BP 145/87 | HR 74 | Ht 65.0 in | Wt 187.0 lb

## 2019-03-31 DIAGNOSIS — R5383 Other fatigue: Secondary | ICD-10-CM

## 2019-03-31 DIAGNOSIS — R42 Dizziness and giddiness: Secondary | ICD-10-CM | POA: Diagnosis not present

## 2019-03-31 DIAGNOSIS — I10 Essential (primary) hypertension: Secondary | ICD-10-CM | POA: Diagnosis not present

## 2019-03-31 MED ORDER — HYDROCHLOROTHIAZIDE 25 MG PO TABS: 25.0000 mg | ORAL_TABLET | Freq: Every day | ORAL | 1 refills | Status: DC

## 2019-03-31 NOTE — Progress Notes (Signed)
Virtual Visit via video ([email or web address])    This visit type was conducted due to national recommendations for restrictions regarding the COVID-19 Pandemic (e.g. social distancing) in an effort to limit this patient's exposure and mitigate transmission in our community.  Patients identity confirmed using two different identifiers.  This format is felt to be most appropriate for this patient at this time.  All issues noted in this document were discussed and addressed.  No physical exam was performed (except for noted visual exam findings with Video Visits).    Date:  04/08/2019   ID:  [name], DOB October 27, 1981, MRN [id number]  Patient Location:  Home - spoke with [name]  Provider location:   Office    Chief Complaint:  Dizziness and headache  History of Present Illness:    [name] is a 38 y.o. female who presents via video conferencing for a telehealth visit today.    The patient does not have symptoms concerning for COVID-19 infection (fever, chills, cough, or new shortness of breath).   She awakens multiple times during the night. She is exercising regularly, 5 days a week.  She drinks caffeinated coffee daily  Hypertension  This is a chronic problem. The current episode started more than 1 year ago. The problem is unchanged. The problem is uncontrolled. Associated symptoms include headaches. Pertinent negatives include no anxiety, blurred vision, chest pain, neck pain or peripheral edema. There are no associated agents to hypertension. Past treatments include diuretics. There are no compliance problems.  There is no history of angina.  Headache   This is a chronic problem. The current episode started in the past 7 days. The pain does not radiate. The pain quality is not similar to prior headaches. The quality of the pain is described as aching and squeezing (temporal areas today started in the top of her head). The pain is at a severity of 5/10. Pertinent  negatives include no blurred vision, dizziness or neck pain. The symptoms are aggravated by fatigue. Her past medical history is significant for hypertension.     Past Medical History:  Diagnosis Date  . Gestational hypertension    Past Surgical History:  Procedure Laterality Date  . CESAREAN SECTION    . INGUINAL HERNIA REPAIR       Current Meds  Medication Sig  . cetirizine (ZYRTEC) 10 MG tablet Take 1 tablet (10 mg total) by mouth daily.  Marland Kitchen omeprazole (PRILOSEC) 20 MG capsule TAKE 1 CAPSULE BY MOUTH EVERY DAY  . Probiotic Product (PROBIOTIC-10 PO) Take 1 tablet by mouth daily.  . [DISCONTINUED] hydrochlorothiazide (HYDRODIURIL) 12.5 MG tablet Take 12.5 mg by mouth daily.     Allergies:   Diflucan [fluconazole]   Social History   Tobacco Use  . Smoking status: Never Smoker  . Smokeless tobacco: Never Used  Substance Use Topics  . Alcohol use: No  . Drug use: No     Family Hx: The patient's family history is negative for Other.  ROS:   Please see the history of present illness.    Review of Systems  Eyes: Negative for blurred vision.  Cardiovascular: Negative for chest pain.  Musculoskeletal: Negative for neck pain.  Neurological: Positive for headaches. Negative for dizziness.    All other systems reviewed and are negative.   Labs/Other Tests and Data Reviewed:    Recent Labs: 01/11/2019: ALT 25 04/01/2019: BUN 13; Creatinine, Ser 0.97; Hemoglobin 13.6; Platelets 329; Potassium 4.0; Sodium 144; TSH 2.620   Recent  Lipid Panel No results found for: CHOL, TRIG, HDL, CHOLHDL, LDLCALC, LDLDIRECT  Wt Readings from Last 3 Encounters:  03/31/19 187 lb (84.8 kg)  01/11/19 188 lb 6.4 oz (85.5 kg)  09/21/18 179 lb 6.4 oz (81.4 kg)     Exam:    Vital Signs:  BP (!) 145/87 (BP Location: Left Arm, Patient Position: Sitting, Cuff Size: Small)   Pulse 74   Ht _0  (1.651 m)   Wt 187 lb (84.8 kg)   BMI 31.12 kg/m     Physical Exam  Constitutional: She is  oriented to person, place, and time and well-developed, well-nourished, and in no distress.  Cardiovascular:  Negative for JVD  Pulmonary/Chest: Effort normal.  Neurological: She is alert and oriented to person, place, and time.  Psychiatric: Mood, memory, affect and judgment normal.    ASSESSMENT & PLAN:    1. Essential hypertension  Chronic  Blood pressure is up slightly she is to continue with HCTZ - hydrochlorothiazide (HYDRODIURIL) 25 MG tablet; Take 1 tablet (25 mg total) by mouth daily.  Dispense: 90 tablet; Refill: 1 - CBC no Diff; Future - BMP8+eGFR; Future  2. Dizziness  Will check labs tomorrow to see if any abnormalities  Encouraged to make sure she is well hydrated with water.  - CBC no Diff; Future - BMP8+eGFR; Future - TSH; Future - Vitamin B12; Future  3. Fatigue, unspecified type  Will check metabolic panel  This may be related to lack of sleep, will find a medication that will help with both headache and sleep  I have encouraged her to increase her physical activity - TSH; Future - Vitamin B12; Future   COVID-19 Education: The signs and symptoms of COVID-19 were discussed with the patient and how to seek care for testing (follow up with PCP or arrange E-visit).  The importance of social distancing was discussed today.  Patient Risk:   After full review of this patients clinical status, I feel that they are at least moderate risk at this time.  Time:   Today, I have spent 19 minutes/ seconds with the patient with telehealth technology discussing above diagnoses.     Medication Adjustments/Labs and Tests Ordered: Current medicines are reviewed at length with the patient today.  Concerns regarding medicines are outlined above.   Tests Ordered: Orders Placed This Encounter  Procedures  . CBC no Diff  . BMP8+eGFR  . TSH  . Vitamin B12    Medication Changes: Meds ordered this encounter  Medications  . hydrochlorothiazide (HYDRODIURIL) 25 MG  tablet    Sig: Take 1 tablet (25 mg total) by mouth daily.    Dispense:  90 tablet    Refill:  1    Disposition:  Follow up prn  Signed, Minette Brine, FNP

## 2019-04-01 ENCOUNTER — Other Ambulatory Visit: Payer: BLUE CROSS/BLUE SHIELD

## 2019-04-01 DIAGNOSIS — R5383 Other fatigue: Secondary | ICD-10-CM

## 2019-04-01 DIAGNOSIS — I10 Essential (primary) hypertension: Secondary | ICD-10-CM | POA: Diagnosis not present

## 2019-04-01 DIAGNOSIS — R42 Dizziness and giddiness: Secondary | ICD-10-CM | POA: Diagnosis not present

## 2019-04-02 LAB — CBC
Hematocrit: 40 % (ref 34.0–46.6)
Hemoglobin: 13.6 g/dL (ref 11.1–15.9)
MCH: 30.9 pg (ref 26.6–33.0)
MCHC: 34 g/dL (ref 31.5–35.7)
MCV: 91 fL (ref 79–97)
Platelets: 329 10*3/uL (ref 150–450)
RBC: 4.4 x10E6/uL (ref 3.77–5.28)
RDW: 12 % (ref 11.7–15.4)
WBC: 3.7 10*3/uL (ref 3.4–10.8)

## 2019-04-02 LAB — BMP8+EGFR
BUN/Creatinine Ratio: 13 (ref 9–23)
BUN: 13 mg/dL (ref 6–20)
CO2: 25 mmol/L (ref 20–29)
Calcium: 9.8 mg/dL (ref 8.7–10.2)
Chloride: 105 mmol/L (ref 96–106)
Creatinine, Ser: 0.97 mg/dL (ref 0.57–1.00)
GFR calc Af Amer: 87 mL/min/1.73
GFR calc non Af Amer: 75 mL/min/1.73
Glucose: 89 mg/dL (ref 65–99)
Potassium: 4 mmol/L (ref 3.5–5.2)
Sodium: 144 mmol/L (ref 134–144)

## 2019-04-02 LAB — VITAMIN B12: Vitamin B-12: 530 pg/mL (ref 232–1245)

## 2019-04-02 LAB — TSH: TSH: 2.62 u[IU]/mL (ref 0.450–4.500)

## 2019-04-08 ENCOUNTER — Encounter: Payer: Self-pay | Admitting: Nurse Practitioner

## 2019-04-17 ENCOUNTER — Other Ambulatory Visit: Payer: Self-pay | Admitting: Nurse Practitioner

## 2019-04-23 ENCOUNTER — Other Ambulatory Visit: Payer: Self-pay | Admitting: Nurse Practitioner

## 2019-04-23 DIAGNOSIS — K219 Gastro-esophageal reflux disease without esophagitis: Secondary | ICD-10-CM

## 2019-06-25 ENCOUNTER — Other Ambulatory Visit: Payer: Self-pay | Admitting: Nurse Practitioner

## 2019-06-25 DIAGNOSIS — K219 Gastro-esophageal reflux disease without esophagitis: Secondary | ICD-10-CM

## 2019-07-29 ENCOUNTER — Other Ambulatory Visit: Payer: Self-pay | Admitting: Nurse Practitioner

## 2019-07-29 DIAGNOSIS — K219 Gastro-esophageal reflux disease without esophagitis: Secondary | ICD-10-CM

## 2019-08-02 ENCOUNTER — Ambulatory Visit: Payer: BLUE CROSS/BLUE SHIELD | Admitting: Nurse Practitioner

## 2019-08-02 ENCOUNTER — Other Ambulatory Visit: Payer: Self-pay

## 2019-08-02 ENCOUNTER — Encounter: Payer: Self-pay | Admitting: Nurse Practitioner

## 2019-08-02 ENCOUNTER — Other Ambulatory Visit: Payer: Self-pay | Admitting: Nurse Practitioner

## 2019-08-02 VITALS — BP 126/80 | HR 64 | Temp 98.4°F | Ht 65.4 in | Wt 203.8 lb

## 2019-08-02 DIAGNOSIS — Z23 Encounter for immunization: Secondary | ICD-10-CM | POA: Diagnosis not present

## 2019-08-02 DIAGNOSIS — M79604 Pain in right leg: Secondary | ICD-10-CM | POA: Diagnosis not present

## 2019-08-02 DIAGNOSIS — I10 Essential (primary) hypertension: Secondary | ICD-10-CM | POA: Diagnosis not present

## 2019-08-02 NOTE — Progress Notes (Signed)
Subjective:     Patient ID: [name] , female    DOB: 1981/01/23 , 38 y.o.   MRN: [id number]   Chief Complaint  Patient presents with  . Hypertension    HPI  Continues to work from home goes into the office once every 2 weeks.    Random sharp pain to right lower extremity.  No exercising since Thursday.  Denies any issues walking.  She has been doing more walking.  Denies low back pain. Had intermittent sharp shooting pains, rubbed the area with relief.  Hypertension This is a chronic problem. The current episode started more than 1 month ago. The problem is controlled. Pertinent negatives include no anxiety, chest pain, headaches, palpitations or shortness of breath. Risk factors for coronary artery disease include obesity and sedentary lifestyle. Past treatments include diuretics. The current treatment provides no improvement. Compliance problems include exercise.  There is no history of angina or kidney disease. There is no history of chronic renal disease.     Past Medical History:  Diagnosis Date  . Gestational hypertension      Family History  Problem Relation Age of Onset  . Other Neg Hx      Current Outpatient Medications:  .  cetirizine (ZYRTEC) 10 MG tablet, Take 1 tablet (10 mg total) by mouth daily., Disp: 90 tablet, Rfl: 1 .  hydrochlorothiazide (HYDRODIURIL) 25 MG tablet, Take 1 tablet (25 mg total) by mouth daily., Disp: 90 tablet, Rfl: 1 .  omeprazole (PRILOSEC) 20 MG capsule, TAKE 1 CAPSULE BY MOUTH EVERY DAY, Disp: 30 capsule, Rfl: 1 .  Probiotic Product (PROBIOTIC-10 PO), Take 1 tablet by mouth daily., Disp: , Rfl:  .  triamcinolone (NASACORT) 55 MCG/ACT AERO nasal inhaler, Place 2 sprays into the nose daily. (Patient not taking: Reported on 03/31/2019), Disp: 1 Inhaler, Rfl: 12   Allergies  Allergen Reactions  . Diflucan [Fluconazole] Other (See Comments)    Skin on her hand burned off.     Review of Systems  Constitutional: Negative.    Respiratory: Negative.  Negative for shortness of breath and wheezing.   Cardiovascular: Negative.  Negative for chest pain, palpitations and leg swelling.  Gastrointestinal: Negative for constipation.  Genitourinary: Negative.   Musculoskeletal: Negative.   Skin: Negative.   Neurological: Negative for dizziness and headaches.     Today's Vitals   08/02/19 0844  BP: 126/80  Pulse: 64  Temp: 98.4 F (36.9 C)  TempSrc: Oral  Weight: 203 lb 12.8 oz (92.4 kg)  Height: 5' 5.4" (1.661 m)  PainSc: 0-No pain   Body mass index is 33.5 kg/m.   Objective:  Physical Exam Constitutional:      General: She is not in acute distress.    Appearance: She is obese.  Cardiovascular:     Rate and Rhythm: Normal rate and regular rhythm.     Pulses: Normal pulses.     Heart sounds: Normal heart sounds. No murmur.  Pulmonary:     Effort: Pulmonary effort is normal. No respiratory distress.     Breath sounds: Normal breath sounds. No wheezing.  Abdominal:     General: Abdomen is flat. Bowel sounds are normal. There is no distension.     Palpations: Abdomen is soft. There is no mass.     Tenderness: There is no abdominal tenderness.  Skin:    General: Skin is warm and dry.  Neurological:     General: No focal deficit present.     Mental Status:  She is alert and oriented to person, place, and time.  Psychiatric:        Mood and Affect: Mood normal.        Behavior: Behavior normal.        Thought Content: Thought content normal.        Judgment: Judgment normal.         Assessment And Plan:     1. Essential hypertension . B/P is controlled.  . CMP ordered to check renal function.  . The importance of regular exercise and dietary modification was stressed to the patient.  . Stressed importance of losing ten percent of her body weight to help with B/P control.  . The weight loss would help with decreasing cardiac and cancer risk as well.   - BMP8+eGFR  2. Right leg pain  No  swelling noted due to the sudden onset of pain I would like to check a d-dimer to evaluate for possible clot  Encouraged to take aspirin 46m daily and if worse to return call to office - [facility]  3. Need for influenza vaccination  Influenza vaccine given in office  Advised to take Tylenol as needed for muscle aches or fever - Flu Vaccine QUAD 6+ mos PF IM (Fluarix Quad PF)     JMinette Brine FNP

## 2019-08-02 NOTE — Patient Instructions (Signed)
Influenza Virus Vaccine (Flucelvax) What is this medicine? INFLUENZA VIRUS VACCINE (in floo EN zuh VAHY ruhs vak SEEN) helps to reduce the risk of getting influenza also known as the flu. The vaccine only helps protect you against some strains of the flu. This medicine may be used for other purposes; ask your health care provider or pharmacist if you have questions. COMMON BRAND NAME(S): FLUCELVAX What should I tell my health care provider before I take this medicine? They need to know if you have any of these conditions:  bleeding disorder like hemophilia  fever or infection  Guillain-Barre syndrome or other neurological problems  immune system problems  infection with the human immunodeficiency virus (HIV) or AIDS  low blood platelet counts  multiple sclerosis  an unusual or allergic reaction to influenza virus vaccine, other medicines, foods, dyes or preservatives  pregnant or trying to get pregnant  breast-feeding How should I use this medicine? This vaccine is for injection into a muscle. It is given by a health care professional. A copy of Vaccine Information Statements will be given before each vaccination. Read this sheet carefully each time. The sheet may change frequently. Talk to your pediatrician regarding the use of this medicine in children. Special care may be needed. Overdosage: If you think you've taken too much of this medicine contact a poison control center or emergency room at once. Overdosage: If you think you have taken too much of this medicine contact a poison control center or emergency room at once. NOTE: This medicine is only for you. Do not share this medicine with others. What if I miss a dose? This does not apply. What may interact with this medicine?  chemotherapy or radiation therapy  medicines that lower your immune system like etanercept, anakinra, infliximab, and adalimumab  medicines that treat or prevent blood clots like  warfarin  phenytoin  steroid medicines like prednisone or cortisone  theophylline  vaccines This list may not describe all possible interactions. Give your health care provider a list of all the medicines, herbs, non-prescription drugs, or dietary supplements you use. Also tell them if you smoke, drink alcohol, or use illegal drugs. Some items may interact with your medicine. What should I watch for while using this medicine? Report any side effects that do not go away within 3 days to your doctor or health care professional. Call your health care provider if any unusual symptoms occur within 6 weeks of receiving this vaccine. You may still catch the flu, but the illness is not usually as bad. You cannot get the flu from the vaccine. The vaccine will not protect against colds or other illnesses that may cause fever. The vaccine is needed every year. What side effects may I notice from receiving this medicine? Side effects that you should report to your doctor or health care professional as soon as possible:  allergic reactions like skin rash, itching or hives, swelling of the face, lips, or tongue Side effects that usually do not require medical attention (Report these to your doctor or health care professional if they continue or are bothersome.):  fever  headache  muscle aches and pains  pain, tenderness, redness, or swelling at the injection site  tiredness This list may not describe all possible side effects. Call your doctor for medical advice about side effects. You may report side effects to FDA at 1-800-FDA-1088. Where should I keep my medicine? The vaccine will be given by a health care professional in a clinic, pharmacy, doctor's   office, or other health care setting. You will not be given vaccine doses to store at home. NOTE: This sheet is a summary. It may not cover all possible information. If you have questions about this medicine, talk to your doctor, pharmacist, or  health care provider.  2020 Elsevier/Gold Standard (2011-10-09 14:06:47)  

## 2019-08-03 LAB — BMP8+EGFR
BUN/Creatinine Ratio: 13 (ref 9–23)
BUN: 15 mg/dL (ref 6–20)
CO2: 24 mmol/L (ref 20–29)
Calcium: 9.7 mg/dL (ref 8.7–10.2)
Chloride: 104 mmol/L (ref 96–106)
Creatinine, Ser: 1.13 mg/dL — ABNORMAL HIGH (ref 0.57–1.00)
GFR calc Af Amer: 72 mL/min/1.73
GFR calc non Af Amer: 62 mL/min/1.73
Glucose: 82 mg/dL (ref 65–99)
Potassium: 4.2 mmol/L (ref 3.5–5.2)
Sodium: 139 mmol/L (ref 134–144)

## 2019-08-03 LAB — D-DIMER, QUANTITATIVE: D-DIMER: 0.31 mg{FEU}/L (ref 0.00–0.49)

## 2019-08-31 ENCOUNTER — Other Ambulatory Visit: Payer: Self-pay | Admitting: Nurse Practitioner

## 2019-08-31 DIAGNOSIS — K219 Gastro-esophageal reflux disease without esophagitis: Secondary | ICD-10-CM

## 2019-09-17 ENCOUNTER — Inpatient Hospital Stay
Admission: RE | Admit: 2019-09-17 | Discharge: 2019-09-17 | Disposition: A | Discharge status: Home / Self Care | Payer: BLUE CROSS/BLUE SHIELD | Source: Ambulatory Visit

## 2019-09-17 ENCOUNTER — Encounter: Payer: Self-pay | Admitting: Nurse Practitioner

## 2019-09-18 ENCOUNTER — Other Ambulatory Visit: Payer: Self-pay

## 2019-09-18 ENCOUNTER — Ambulatory Visit (HOSPITAL_COMMUNITY)
Admission: EM | Admit: 2019-09-18 | Discharge: 2019-09-18 | Disposition: A | Discharge status: Home / Self Care | Payer: BLUE CROSS/BLUE SHIELD | Attending: Urgent Care | Admitting: Urgent Care

## 2019-09-18 ENCOUNTER — Encounter (HOSPITAL_COMMUNITY): Payer: Self-pay | Admitting: Emergency Medicine

## 2019-09-18 DIAGNOSIS — R1013 Epigastric pain: Secondary | ICD-10-CM | POA: Diagnosis not present

## 2019-09-18 DIAGNOSIS — K219 Gastro-esophageal reflux disease without esophagitis: Secondary | ICD-10-CM | POA: Insufficient documentation

## 2019-09-18 DIAGNOSIS — R1030 Lower abdominal pain, unspecified: Secondary | ICD-10-CM | POA: Diagnosis not present

## 2019-09-18 DIAGNOSIS — R1084 Generalized abdominal pain: Secondary | ICD-10-CM | POA: Diagnosis not present

## 2019-09-18 DIAGNOSIS — Z3202 Encounter for pregnancy test, result negative: Secondary | ICD-10-CM | POA: Diagnosis not present

## 2019-09-18 LAB — POCT URINALYSIS DIP (DEVICE)
Bilirubin Urine: NEGATIVE
Glucose, UA: NEGATIVE mg/dL
Hgb urine dipstick: NEGATIVE
Ketones, ur: NEGATIVE mg/dL
Leukocytes,Ua: NEGATIVE
Nitrite: NEGATIVE
Protein, ur: NEGATIVE mg/dL
Specific Gravity, Urine: 1.015 (ref 1.005–1.030)
Urobilinogen, UA: 0.2 mg/dL (ref 0.0–1.0)
pH: 7.5 (ref 5.0–8.0)

## 2019-09-18 LAB — POCT PREGNANCY, URINE: Preg Test, Ur: NEGATIVE

## 2019-09-18 MED ORDER — FAMOTIDINE 20 MG PO TABS: 20.0000 mg | ORAL_TABLET | Freq: Two times a day (BID) | ORAL | 0 refills | Status: DC

## 2019-09-18 NOTE — Discharge Instructions (Signed)
Salads - kale, spinach, cabbage, spring mix Fruits - avocadoes, berries (blueberries, raspberries, blackberries), apples, oranges, pomegranate, grapefruit Vegetables - aspargus, cauliflower, broccoli, green beans, brussel spouts, bell peppers; stay away from starchy vegetables like potatoes, carrots, peas

## 2019-09-18 NOTE — ED Triage Notes (Signed)
Abdomina pain started Wednesday.  Initially the lower abdomen pressure.  Now that continues and a bloating, nauseated feeling in epigastric area.  No vomiting.  No diarrhea, but has been having more frequent bm's.  More urgent episodes to urinate, but no pain with urination.

## 2019-09-18 NOTE — ED Provider Notes (Signed)
MRN: [id number] DOB: 04/05/1981  Subjective:   [name] is a 38 y.o. female presenting for 4 day hx of mostly constant moderate with intermittent severe sharp abdominal pains. Otherwise, pain is generally aching and dull. Has also had associated abdominal bloating and nausea. Has also been having more frequent stools. Has tried Tums (takes this regularly). Has reflux, takes omeprazole regularly. Has also tried Pepto with some relief. LMP is unknown. Patient is on Mirena, was placed 2019. Diet is mixed foods, not actively healthy.   No current facility-administered medications for this encounter.   Current Outpatient Medications:  .  cetirizine (ZYRTEC) 10 MG tablet, Take 1 tablet (10 mg total) by mouth daily., Disp: 90 tablet, Rfl: 1 .  hydrochlorothiazide (HYDRODIURIL) 25 MG tablet, Take 1 tablet (25 mg total) by mouth daily., Disp: 90 tablet, Rfl: 1 .  omeprazole (PRILOSEC) 20 MG capsule, TAKE 1 CAPSULE BY MOUTH EVERY DAY, Disp: 30 capsule, Rfl: 1 .  Probiotic Product (PROBIOTIC-10 PO), Take 1 tablet by mouth daily., Disp: , Rfl:  .  triamcinolone (NASACORT) 55 MCG/ACT AERO nasal inhaler, Place 2 sprays into the nose daily. (Patient not taking: Reported on 03/31/2019), Disp: 1 Inhaler, Rfl: 12    Allergies  Allergen Reactions  . Diflucan [Fluconazole] Other (See Comments)    Skin on her hand burned off.    Past Medical History:  Diagnosis Date  . Gestational hypertension      Past Surgical History:  Procedure Laterality Date  . CESAREAN SECTION    . INGUINAL HERNIA REPAIR      Review of Systems  Constitutional: Negative for fever and malaise/fatigue.  HENT: Negative for congestion, ear pain, sinus pain and sore throat.   Eyes: Negative for discharge and redness.  Respiratory: Negative for cough, hemoptysis, shortness of breath and wheezing.   Cardiovascular: Negative for chest pain.  Gastrointestinal: Positive for abdominal pain and nausea. Negative for blood in  stool, constipation, diarrhea and vomiting.  Genitourinary: Positive for urgency. Negative for dysuria, flank pain, frequency and hematuria.  Musculoskeletal: Negative for myalgias.  Skin: Negative for rash.  Neurological: Negative for dizziness, weakness and headaches.  Psychiatric/Behavioral: Negative for depression and substance abuse.    Social History   Tobacco Use  . Smoking status: Never Smoker  . Smokeless tobacco: Never Used  Substance Use Topics  . Alcohol use: No  . Drug use: No     Family History  Problem Relation Age of Onset  . Hypertension Mother   . Hypertension Father   . Other Neg Hx      Objective:   Vitals: BP 137/89 (BP Location: Right Arm) Comment (BP Location): repositioned  Pulse (!) 59   Temp 98.9 F (37.2 C) (Oral)   Resp 18   SpO2 100%   Physical Exam Constitutional:      General: She is not in acute distress.    Appearance: Normal appearance. She is well-developed and normal weight. She is not ill-appearing, toxic-appearing or diaphoretic.  HENT:     Head: Normocephalic and atraumatic.     Right Ear: External ear normal.     Left Ear: External ear normal.     Nose: Nose normal.     Mouth/Throat:     Mouth: Mucous membranes are moist.     Pharynx: Oropharynx is clear.  Eyes:     General: No scleral icterus.    Extraocular Movements: Extraocular movements intact.     Pupils: Pupils are equal, round, and reactive to  light.  Cardiovascular:     Rate and Rhythm: Normal rate and regular rhythm.     Heart sounds: Normal heart sounds. No murmur. No friction rub. No gallop.   Pulmonary:     Effort: Pulmonary effort is normal. No respiratory distress.     Breath sounds: Normal breath sounds. No stridor. No wheezing, rhonchi or rales.  Abdominal:     General: Bowel sounds are normal. There is no distension.     Palpations: Abdomen is soft. There is no mass.     Tenderness: There is abdominal tenderness (Worse over epigastric, LUQ and RLQ)  in the right upper quadrant, right lower quadrant, epigastric area, left upper quadrant and left lower quadrant. There is no right CVA tenderness, left CVA tenderness, guarding or rebound. Negative signs include Murphy's sign and McBurney's sign.  Skin:    General: Skin is warm and dry.     Coloration: Skin is not pale.     Findings: No rash.  Neurological:     General: No focal deficit present.     Mental Status: She is alert and oriented to person, place, and time.  Psychiatric:        Mood and Affect: Mood normal.        Behavior: Behavior normal.        Thought Content: Thought content normal.        Judgment: Judgment normal.      Results for orders placed or performed during the hospital encounter of 09/18/19 (from the past 24 hour(s))  POCT urinalysis dip (device)     Status: None   Collection Time: 09/18/19 12:56 PM  Result Value Ref Range   Glucose, UA NEGATIVE NEGATIVE mg/dL   Bilirubin Urine NEGATIVE NEGATIVE   Ketones, ur NEGATIVE NEGATIVE mg/dL   Specific Gravity, Urine 1.015 1.005 - 1.030   Hgb urine dipstick NEGATIVE NEGATIVE   pH 7.5 5.0 - 8.0   Protein, ur NEGATIVE NEGATIVE mg/dL   Urobilinogen, UA 0.2 0.0 - 1.0 mg/dL   Nitrite NEGATIVE NEGATIVE   Leukocytes,Ua NEGATIVE NEGATIVE  Pregnancy, urine POC     Status: None   Collection Time: 09/18/19 12:58 PM  Result Value Ref Range   Preg Test, Ur NEGATIVE NEGATIVE    Assessment and Plan :   1. Generalized abdominal pain   2. Epigastric pain   3. Lower abdominal pain   4. Gastroesophageal reflux disease, unspecified whether esophagitis present     Discussed multiple etiologies for her generalized abdominal pain including PUD, H. pylori infection, gastritis, hiatal hernia, appendicitis, IBS.  For now, patient is not a good candidate for the emergency room given stable vital signs.  She was agreeable to increasing her dose of omeprazole to twice a day and starting Pepcid.  She will make dietary modifications by  avoiding acidic foods and eating low fat, high fiber diet.  Urine culture is pending.  Emphasized need to follow-up again with the GI doctor as I suspect she will need an endoscopy.  Follow-up with PCP ASAP.  Strict ER precautions. Counseled patient on potential for adverse effects with medications prescribed today, patient verbalized understanding.    Jaynee Eagles, PA-C 09/18/19 1436

## 2019-09-19 ENCOUNTER — Encounter: Payer: Self-pay | Admitting: Nurse Practitioner

## 2019-09-19 LAB — URINE CULTURE: Culture: NO GROWTH

## 2019-09-21 ENCOUNTER — Other Ambulatory Visit: Payer: Self-pay

## 2019-09-21 ENCOUNTER — Encounter: Payer: Self-pay | Admitting: Nurse Practitioner

## 2019-09-21 ENCOUNTER — Ambulatory Visit: Payer: BLUE CROSS/BLUE SHIELD | Admitting: Nurse Practitioner

## 2019-09-21 VITALS — BP 110/80 | HR 60 | Temp 98.6°F | Ht 64.2 in | Wt 201.0 lb

## 2019-09-21 DIAGNOSIS — R109 Unspecified abdominal pain: Secondary | ICD-10-CM | POA: Diagnosis not present

## 2019-09-21 DIAGNOSIS — R6884 Jaw pain: Secondary | ICD-10-CM | POA: Diagnosis not present

## 2019-09-21 MED ORDER — CYCLOBENZAPRINE HCL 10 MG PO TABS: 10.0000 mg | ORAL_TABLET | Freq: Three times a day (TID) | ORAL | 0 refills | Status: DC | PRN

## 2019-09-21 NOTE — Progress Notes (Signed)
Subjective:     Patient ID: [name] , female    DOB: 01-Mar-1981 , 38 y.o.   MRN: [id number]   Chief Complaint  Patient presents with  . Abdominal Pain    patient went to urgent care on saturday and was given pepcid and that has been working she is no longer having pain just in the mornings  . jaw concerns    wants referral    HPI  Earlier in the week she thinks she dislocated her right jaw.    Lower abdomen discomfort after driving.  She was advised to stay away from acid containing foods.    She was given a referral in March 2020 but did not go due to Covid.    Right groin discomfort - [facility], Mirena in place. She is due for her yearly exam.    Last week she yawned heard a pop in her right jaw and now is unable to chew well on that side.  She is having intermittent headaches as well.    Abdominal Pain This is a recurrent problem. The current episode started more than 1 month ago. The problem occurs intermittently. The problem has been gradually improving. The pain is located in the LLQ. The pain is mild. The quality of the pain is colicky and dull. Pertinent negatives include no diarrhea or nausea. She has tried H2 blockers (probiotic) for the symptoms. Prior workup: She was referred to GI earlier this year but she did not go due to Coronavirus.     Past Medical History:  Diagnosis Date  . Gestational hypertension      Family History  Problem Relation Age of Onset  . Hypertension Mother   . Hypertension Father   . Other Neg Hx      Current Outpatient Medications:  .  cetirizine (ZYRTEC) 10 MG tablet, Take 1 tablet (10 mg total) by mouth daily., Disp: 90 tablet, Rfl: 1 .  famotidine (PEPCID) 20 MG tablet, Take 1 tablet (20 mg total) by mouth 2 (two) times daily., Disp: 60 tablet, Rfl: 0 .  hydrochlorothiazide (HYDRODIURIL) 25 MG tablet, Take 1 tablet (25 mg total) by mouth daily., Disp: 90 tablet, Rfl: 1 .  omeprazole (PRILOSEC) 20 MG capsule, TAKE 1  CAPSULE BY MOUTH EVERY DAY, Disp: 30 capsule, Rfl: 1 .  Probiotic Product (PROBIOTIC-10 PO), Take 1 tablet by mouth daily., Disp: , Rfl:  .  triamcinolone (NASACORT) 55 MCG/ACT AERO nasal inhaler, Place 2 sprays into the nose daily. (Patient not taking: Reported on 03/31/2019), Disp: 1 Inhaler, Rfl: 12   Allergies  Allergen Reactions  . Diflucan [Fluconazole] Other (See Comments)    Skin on her hand burned off.     Review of Systems  Constitutional: Negative.   Respiratory: Negative.   Cardiovascular: Negative.   Gastrointestinal: Positive for abdominal pain. Negative for diarrhea and nausea.  Neurological: Negative.   Psychiatric/Behavioral: Negative.      Today's Vitals   09/21/19 1206  BP: 110/80  Pulse: 60  Temp: 98.6 F (37 C)  TempSrc: Oral  Weight: 201 lb (91.2 kg)  Height: 5' 4.2" (1.631 m)  PainSc: 2   PainLoc: Jaw   Body mass index is 34.29 kg/m.   Objective:  Physical Exam Vitals signs reviewed.  Constitutional:      Appearance: She is well-developed.  Abdominal:     General: Bowel sounds are normal.     Palpations: Abdomen is rigid. There is no shifting dullness or mass.  Tenderness: There is no abdominal tenderness.  Skin:    Capillary Refill: Capillary refill takes less than 2 seconds.  Neurological:     General: No focal deficit present.     Mental Status: She is alert. She is disoriented.  Psychiatric:        Mood and Affect: Mood normal. Mood is not anxious.        Behavior: Behavior normal.         Assessment And Plan:     1. Jaw pain  Right jaw pain after yawning tenderness to area with movement  No clicking noted   I will refer to ENT at patient request  Likely TMJ - encouraged to eat soft foods and be cautious with yawning - Ambulatory referral to ENT - cyclobenzaprine (FLEXERIL) 10 MG tablet; Take 1 tablet (10 mg total) by mouth 3 (three) times daily as needed for muscle spasms.  Dispense: 20 tablet; Refill: 0  2. Abdominal  discomfort  This is ongoing abdominal discomfort she had a recent ER visit and was advised to increase her omeprazole to twice daily and she started pepcid feels some relief.   No tenderness noted on physical exam  Arnette Felts, FNP    THE PATIENT IS ENCOURAGED TO PRACTICE SOCIAL DISTANCING DUE TO THE COVID-19 PANDEMIC.

## 2019-09-21 NOTE — Patient Instructions (Signed)

## 2019-09-23 ENCOUNTER — Encounter: Payer: Self-pay | Admitting: Nurse Practitioner

## 2019-09-23 ENCOUNTER — Other Ambulatory Visit: Payer: Self-pay | Admitting: Nurse Practitioner

## 2019-09-23 DIAGNOSIS — K219 Gastro-esophageal reflux disease without esophagitis: Secondary | ICD-10-CM

## 2019-09-24 ENCOUNTER — Other Ambulatory Visit: Payer: Self-pay | Admitting: Nurse Practitioner

## 2019-09-24 DIAGNOSIS — K219 Gastro-esophageal reflux disease without esophagitis: Secondary | ICD-10-CM

## 2019-09-29 ENCOUNTER — Other Ambulatory Visit: Payer: Self-pay

## 2019-09-29 ENCOUNTER — Ambulatory Visit (INDEPENDENT_AMBULATORY_CARE_PROVIDER_SITE_OTHER): Payer: BLUE CROSS/BLUE SHIELD | Admitting: Nurse Practitioner

## 2019-09-29 ENCOUNTER — Encounter: Payer: Self-pay | Admitting: Nurse Practitioner

## 2019-09-29 VITALS — BP 122/90 | HR 78 | Temp 98.0°F | Ht 66.4 in | Wt 201.6 lb

## 2019-09-29 DIAGNOSIS — Z6832 Body mass index (BMI) 32.0-32.9, adult: Secondary | ICD-10-CM

## 2019-09-29 DIAGNOSIS — Z13228 Encounter for screening for other metabolic disorders: Secondary | ICD-10-CM

## 2019-09-29 DIAGNOSIS — Z0000 Encounter for general adult medical examination without abnormal findings: Secondary | ICD-10-CM | POA: Diagnosis not present

## 2019-09-29 DIAGNOSIS — E669 Obesity, unspecified: Secondary | ICD-10-CM | POA: Diagnosis not present

## 2019-09-29 DIAGNOSIS — I10 Essential (primary) hypertension: Secondary | ICD-10-CM | POA: Insufficient documentation

## 2019-09-29 LAB — POCT URINALYSIS DIPSTICK
Bilirubin, UA: NEGATIVE
Blood, UA: NEGATIVE
Glucose, UA: NEGATIVE
Ketones, UA: NEGATIVE
Leukocytes, UA: NEGATIVE
Nitrite, UA: NEGATIVE
Protein, UA: NEGATIVE
Spec Grav, UA: 1.015 (ref 1.010–1.025)
Urobilinogen, UA: 0.2 E.U./dL
pH, UA: 7 (ref 5.0–8.0)

## 2019-09-29 LAB — POCT UA - MICROALBUMIN
Albumin/Creatinine Ratio, Urine, POC: <30
Creatinine, POC: 50 mg/dL
Microalbumin Ur, POC: 10 mg/L

## 2019-09-29 NOTE — Progress Notes (Signed)
Subjective:     Patient ID: [name] , female    DOB: 06-10-1981 , 38 y.o.   MRN: [id number]   Chief Complaint  Patient presents with  . Annual Exam    HPI  Here for HM   The patient states she uses IUD for birth control.  Negative for: breast discharge, breast lump(s), breast pain and breast self exam.  Pertinent negatives include abnormal bleeding (hematology), anxiety, decreased libido, depression, difficulty falling sleep, dyspareunia, history of infertility, nocturia, sexual dysfunction, sleep disturbances, urinary incontinence, urinary urgency, vaginal discharge and vaginal itching. Diet regular. The patient states her exercise level is minimal to moderate, 3 times a week will skip weeks.  She is working at home and will go to the office once every 2 weeks.     The patient's tobacco use is:  Social History   Tobacco Use  Smoking Status Never Smoker  Smokeless Tobacco Never Used   She has been exposed to passive smoke. The patient's alcohol use is:  Social History   Substance and Sexual Activity  Alcohol Use No   Additional information: Last pap 08/01/2017, next due in 2021  Past Medical History:  Diagnosis Date  . Gestational hypertension      Family History  Problem Relation Age of Onset  . Hypertension Mother   . Hypertension Father   . Other Neg Hx      Current Outpatient Medications:  .  cetirizine (ZYRTEC) 10 MG tablet, Take 1 tablet (10 mg total) by mouth daily., Disp: 90 tablet, Rfl: 1 .  famotidine (PEPCID) 20 MG tablet, Take 1 tablet (20 mg total) by mouth 2 (two) times daily., Disp: 60 tablet, Rfl: 0 .  hydrochlorothiazide (HYDRODIURIL) 25 MG tablet, Take 1 tablet (25 mg total) by mouth daily., Disp: 90 tablet, Rfl: 1 .  omeprazole (PRILOSEC) 20 MG capsule, TAKE 1 CAPSULE BY MOUTH EVERY DAY, Disp: 30 capsule, Rfl: 1 .  Probiotic Product (PROBIOTIC-10 PO), Take 1 tablet by mouth daily., Disp: , Rfl:  .  cyclobenzaprine (FLEXERIL) 10 MG tablet,  Take 1 tablet (10 mg total) by mouth 3 (three) times daily as needed for muscle spasms. (Patient not taking: Reported on 09/29/2019), Disp: 20 tablet, Rfl: 0 .  triamcinolone (NASACORT) 55 MCG/ACT AERO nasal inhaler, Place 2 sprays into the nose daily. (Patient not taking: Reported on 03/31/2019), Disp: 1 Inhaler, Rfl: 12   Allergies  Allergen Reactions  . Diflucan [Fluconazole] Other (See Comments)    Skin on her hand burned off.     Review of Systems  Constitutional: Negative.  Negative for fatigue.  HENT: Negative.   Eyes: Negative.   Respiratory: Negative.   Cardiovascular: Negative.  Negative for chest pain, palpitations and leg swelling.  Gastrointestinal: Negative.   Endocrine: Negative.   Genitourinary: Negative.   Musculoskeletal: Negative.   Skin: Negative.   Allergic/Immunologic: Negative.   Neurological: Negative.   Hematological: Negative.   Psychiatric/Behavioral: Negative.      Today's Vitals   09/29/19 0942  BP: 122/90  Pulse: 78  Temp: 98 F (36.7 C)  Weight: 201 lb 9.6 oz (91.4 kg)  Height: 5' 6.4" (1.687 m)  PainSc: 0-No pain   Body mass index is 32.15 kg/m.   Objective:  Physical Exam Constitutional:      Appearance: Normal appearance. She is well-developed.  HENT:     Head: Normocephalic and atraumatic.     Right Ear: Hearing, tympanic membrane, ear canal and external ear normal.  Left Ear: Hearing, tympanic membrane, ear canal and external ear normal.     Nose: Nose normal.     Mouth/Throat:     Mouth: Mucous membranes are moist.  Eyes:     General: Lids are normal.     Conjunctiva/sclera: Conjunctivae normal.     Pupils: Pupils are equal, round, and reactive to light.     Funduscopic exam:    Right eye: No papilledema.        Left eye: No papilledema.  Neck:     Musculoskeletal: Full passive range of motion without pain, normal range of motion and neck supple.     Thyroid: No thyroid mass.     Vascular: No carotid bruit.   Cardiovascular:     Rate and Rhythm: Normal rate and regular rhythm.     Pulses: Normal pulses.     Heart sounds: Normal heart sounds. No murmur.  Pulmonary:     Effort: Pulmonary effort is normal.     Breath sounds: Normal breath sounds.  Abdominal:     General: Abdomen is flat. Bowel sounds are normal.     Palpations: Abdomen is soft.  Musculoskeletal: Normal range of motion.        General: No swelling.     Right lower leg: No edema.     Left lower leg: No edema.  Skin:    General: Skin is warm and dry.     Capillary Refill: Capillary refill takes less than 2 seconds.  Neurological:     General: No focal deficit present.     Mental Status: She is alert and oriented to person, place, and time.     Cranial Nerves: No cranial nerve deficit.     Sensory: No sensory deficit.  Psychiatric:        Mood and Affect: Mood normal.        Behavior: Behavior normal.        Thought Content: Thought content normal.        Judgment: Judgment normal.         Assessment And Plan:     1. Health maintenance examination . Behavior modifications discussed and diet history reviewed.   . Pt will continue to exercise regularly and modify diet with low GI, plant based foods and decrease intake of processed foods.  . Recommend intake of daily multivitamin, Vitamin D, and calcium.  . Recommend for preventive screenings, as well as recommend immunizations that include influenza, TDAP (up to date) - CBC without diff - CMP14+EGFR - Lipid Profile - Vitamin D (25 hydroxy)  2. Encounter for screening for metabolic disorder  - Hemoglobin A1c  3. Essential hypertension Chronic, well fair control - POCT Urinalysis Dipstick (81002) - POCT UA - Microalbumin - EKG 12-Lead - CMP14+EGFR  4. Class 1 obesity without serious comorbidity with body mass index (BMI) of 32.0 to 32.9 in adult, unspecified obesity type  Chronic  Discussed healthy diet and regular exercise options   Encouraged to  exercise at least 150 minutes per week with 2 days of strength training    Minette Brine, FNP    THE PATIENT IS ENCOURAGED TO PRACTICE SOCIAL DISTANCING DUE TO THE COVID-19 PANDEMIC.

## 2019-09-30 LAB — CMP14+EGFR
ALT: 20 IU/L (ref 0–32)
AST: 27 IU/L (ref 0–40)
Albumin/Globulin Ratio: 1.6 (ref 1.2–2.2)
Albumin: 4.9 g/dL — ABNORMAL HIGH (ref 3.8–4.8)
Alkaline Phosphatase: 54 IU/L (ref 39–117)
BUN/Creatinine Ratio: 10 (ref 9–23)
BUN: 11 mg/dL (ref 6–20)
Bilirubin Total: 0.4 mg/dL (ref 0.0–1.2)
CO2: 26 mmol/L (ref 20–29)
Calcium: 10.5 mg/dL — ABNORMAL HIGH (ref 8.7–10.2)
Chloride: 100 mmol/L (ref 96–106)
Creatinine, Ser: 1.1 mg/dL — ABNORMAL HIGH (ref 0.57–1.00)
GFR calc Af Amer: 74 mL/min/{1.73_m2} (ref >59)
GFR calc non Af Amer: 64 mL/min/{1.73_m2} (ref >59)
Globulin, Total: 3 g/dL (ref 1.5–4.5)
Glucose: 87 mg/dL (ref 65–99)
Potassium: 3.9 mmol/L (ref 3.5–5.2)
Sodium: 140 mmol/L (ref 134–144)
Total Protein: 7.9 g/dL (ref 6.0–8.5)

## 2019-09-30 LAB — LIPID PANEL
Chol/HDL Ratio: 2.7 ratio (ref 0.0–4.4)
Cholesterol, Total: 159 mg/dL (ref 100–199)
HDL: 60 mg/dL (ref >39)
LDL Chol Calc (NIH): 86 mg/dL (ref 0–99)
Triglycerides: 64 mg/dL (ref 0–149)
VLDL Cholesterol Cal: 13 mg/dL (ref 5–40)

## 2019-09-30 LAB — CBC
Hematocrit: 40.6 % (ref 34.0–46.6)
Hemoglobin: 13.7 g/dL (ref 11.1–15.9)
MCH: 31.1 pg (ref 26.6–33.0)
MCHC: 33.7 g/dL (ref 31.5–35.7)
MCV: 92 fL (ref 79–97)
Platelets: 379 10*3/uL (ref 150–450)
RBC: 4.4 x10E6/uL (ref 3.77–5.28)
RDW: 11.9 % (ref 11.7–15.4)
WBC: 6.4 10*3/uL (ref 3.4–10.8)

## 2019-09-30 LAB — HEMOGLOBIN A1C
Est. average glucose Bld gHb Est-mCnc: 97 mg/dL
Hgb A1c MFr Bld: 5 % (ref 4.8–5.6)

## 2019-09-30 LAB — VITAMIN D 25 HYDROXY (VIT D DEFICIENCY, FRACTURES): Vit D, 25-Hydroxy: 38.6 ng/mL (ref 30.0–100.0)

## 2019-10-05 NOTE — Patient Instructions (Signed)
Health Maintenance  Topic Date Due  . HIV Screening  09/28/2020 (Originally 04/19/1997)  . PAP SMEAR-Modifier  08/01/2020  . TETANUS/TDAP  02/06/2023  . INFLUENZA VACCINE  Completed   Health Maintenance, Female Adopting a healthy lifestyle and getting preventive care are important in promoting health and wellness. Ask your health care provider about:  The right schedule for you to have regular tests and exams.  Things you can do on your own to prevent diseases and keep yourself healthy. What should I know about diet, weight, and exercise? Eat a healthy diet   Eat a diet that includes plenty of vegetables, fruits, low-fat dairy products, and lean protein.  Do not eat a lot of foods that are high in solid fats, added sugars, or sodium. Maintain a healthy weight Body mass index (BMI) is used to identify weight problems. It estimates body fat based on height and weight. Your health care provider can help determine your BMI and help you achieve or maintain a healthy weight. Get regular exercise Get regular exercise. This is one of the most important things you can do for your health. Most adults should:  Exercise for at least 150 minutes each week. The exercise should increase your heart rate and make you sweat (moderate-intensity exercise).  Do strengthening exercises at least twice a week. This is in addition to the moderate-intensity exercise.  Spend less time sitting. Even light physical activity can be beneficial. Watch cholesterol and blood lipids Have your blood tested for lipids and cholesterol at 37 years of age, then have this test every 5 years. Have your cholesterol levels checked more often if:  Your lipid or cholesterol levels are high.  You are older than 37 years of age.  You are at high risk for heart disease. What should I know about cancer screening? Depending on your health history and family history, you may need to have cancer screening at various ages. This may  include screening for:  Breast cancer.  Cervical cancer.  Colorectal cancer.  Skin cancer.  Lung cancer. What should I know about heart disease, diabetes, and high blood pressure? Blood pressure and heart disease  High blood pressure causes heart disease and increases the risk of stroke. This is more likely to develop in people who have high blood pressure readings, are of African descent, or are overweight.  Have your blood pressure checked: ? Every 3-5 years if you are 58-37 years of age. ? Every year if you are 33 years old or older. Diabetes Have regular diabetes screenings. This checks your fasting blood sugar level. Have the screening done:  Once every three years after age 16 if you are at a normal weight and have a low risk for diabetes.  More often and at a younger age if you are overweight or have a high risk for diabetes. What should I know about preventing infection? Hepatitis B If you have a higher risk for hepatitis B, you should be screened for this virus. Talk with your health care provider to find out if you are at risk for hepatitis B infection. Hepatitis C Testing is recommended for:  Everyone born from [birth year] through 1964.  Anyone with known risk factors for hepatitis C. Sexually transmitted infections (STIs)  Get screened for STIs, including gonorrhea and chlamydia, if: ? You are sexually active and are younger than 37 years of age. ? You are older than 37 years of age and your health care provider tells you that you are at  risk for this type of infection. ? Your sexual activity has changed since you were last screened, and you are at increased risk for chlamydia or gonorrhea. Ask your health care provider if you are at risk.  Ask your health care provider about whether you are at high risk for HIV. Your health care provider may recommend a prescription medicine to help prevent HIV infection. If you choose to take medicine to prevent HIV, you should first  get tested for HIV. You should then be tested every 3 months for as long as you are taking the medicine. Pregnancy  If you are about to stop having your period (premenopausal) and you may become pregnant, seek counseling before you get pregnant.  Take 400 to 800 micrograms (mcg) of folic acid every day if you become pregnant.  Ask for birth control (contraception) if you want to prevent pregnancy. Osteoporosis and menopause Osteoporosis is a disease in which the bones lose minerals and strength with aging. This can result in bone fractures. If you are 72 years old or older, or if you are at risk for osteoporosis and fractures, ask your health care provider if you should:  Be screened for bone loss.  Take a calcium or vitamin D supplement to lower your risk of fractures.  Be given hormone replacement therapy (HRT) to treat symptoms of menopause. Follow these instructions at home: Lifestyle  Do not use any products that contain nicotine or tobacco, such as cigarettes, e-cigarettes, and chewing tobacco. If you need help quitting, ask your health care provider.  Do not use street drugs.  Do not share needles.  Ask your health care provider for help if you need support or information about quitting drugs. Alcohol use  Do not drink alcohol if: ? Your health care provider tells you not to drink. ? You are pregnant, may be pregnant, or are planning to become pregnant.  If you drink alcohol: ? Limit how much you use to 0-1 drink a day. ? Limit intake if you are breastfeeding.  Be aware of how much alcohol is in your drink. In the [place] one drink equals one 12 oz bottle of beer (355 mL), one 5 oz glass of wine (148 mL), or one 1 oz glass of hard liquor (44 mL). General instructions  Schedule regular health, dental, and eye exams.  Stay current with your vaccines.  Tell your health care provider if: ? You often feel depressed. ? You have ever been abused or do not feel safe at  home. Summary  Adopting a healthy lifestyle and getting preventive care are important in promoting health and wellness.  Follow your health care provider's instructions about healthy diet, exercising, and getting tested or screened for diseases.  Follow your health care provider's instructions on monitoring your cholesterol and blood pressure. This information is not intended to replace advice given to you by your health care provider. Make sure you discuss any questions you have with your health care provider. Document Released: 05/13/2011 Document Revised: 10/21/2018 Document Reviewed: 10/21/2018 Elsevier Patient Education  2020 [employer].

## 2019-10-06 DIAGNOSIS — R1013 Epigastric pain: Secondary | ICD-10-CM | POA: Diagnosis not present

## 2019-10-06 DIAGNOSIS — Z7982 Long term (current) use of aspirin: Secondary | ICD-10-CM | POA: Diagnosis not present

## 2019-10-06 DIAGNOSIS — K5900 Constipation, unspecified: Secondary | ICD-10-CM | POA: Diagnosis not present

## 2019-10-06 DIAGNOSIS — R110 Nausea: Secondary | ICD-10-CM | POA: Diagnosis not present

## 2019-10-14 ENCOUNTER — Encounter: Payer: Self-pay | Admitting: Nurse Practitioner

## 2019-10-14 ENCOUNTER — Other Ambulatory Visit: Payer: Self-pay | Admitting: Nurse Practitioner

## 2019-10-14 DIAGNOSIS — I10 Essential (primary) hypertension: Secondary | ICD-10-CM

## 2019-10-28 ENCOUNTER — Other Ambulatory Visit: Payer: Self-pay | Admitting: Nurse Practitioner

## 2019-10-28 DIAGNOSIS — K219 Gastro-esophageal reflux disease without esophagitis: Secondary | ICD-10-CM

## 2019-11-22 DIAGNOSIS — I10 Essential (primary) hypertension: Secondary | ICD-10-CM | POA: Diagnosis not present

## 2019-11-22 DIAGNOSIS — Z7189 Other specified counseling: Secondary | ICD-10-CM | POA: Diagnosis not present

## 2019-11-22 DIAGNOSIS — Z03818 Encounter for observation for suspected exposure to other biological agents ruled out: Secondary | ICD-10-CM | POA: Diagnosis not present

## 2019-11-22 DIAGNOSIS — Z20828 Contact with and (suspected) exposure to other viral communicable diseases: Secondary | ICD-10-CM | POA: Diagnosis not present

## 2019-11-30 ENCOUNTER — Encounter: Payer: Self-pay | Admitting: Nurse Practitioner

## 2019-12-01 ENCOUNTER — Other Ambulatory Visit: Payer: Self-pay

## 2019-12-01 DIAGNOSIS — K219 Gastro-esophageal reflux disease without esophagitis: Secondary | ICD-10-CM

## 2019-12-01 MED ORDER — OMEPRAZOLE 20 MG PO CPDR: 20.0000 mg | DELAYED_RELEASE_CAPSULE | Freq: Two times a day (BID) | ORAL | 1 refills | Status: DC

## 2019-12-07 ENCOUNTER — Other Ambulatory Visit: Payer: Self-pay | Admitting: Nurse Practitioner

## 2019-12-07 DIAGNOSIS — K219 Gastro-esophageal reflux disease without esophagitis: Secondary | ICD-10-CM

## 2019-12-08 ENCOUNTER — Other Ambulatory Visit: Payer: Self-pay

## 2019-12-08 DIAGNOSIS — Z20828 Contact with and (suspected) exposure to other viral communicable diseases: Secondary | ICD-10-CM | POA: Diagnosis not present

## 2019-12-08 DIAGNOSIS — Z7189 Other specified counseling: Secondary | ICD-10-CM | POA: Diagnosis not present

## 2019-12-08 MED ORDER — OMEPRAZOLE 40 MG PO CPDR: 40.0000 mg | DELAYED_RELEASE_CAPSULE | Freq: Every day | ORAL | 0 refills | Status: DC

## 2019-12-28 ENCOUNTER — Encounter: Payer: Self-pay | Admitting: Nurse Practitioner

## 2020-01-11 DIAGNOSIS — I10 Essential (primary) hypertension: Secondary | ICD-10-CM | POA: Diagnosis not present

## 2020-01-11 DIAGNOSIS — Z20828 Contact with and (suspected) exposure to other viral communicable diseases: Secondary | ICD-10-CM | POA: Diagnosis not present

## 2020-01-11 DIAGNOSIS — Z03818 Encounter for observation for suspected exposure to other biological agents ruled out: Secondary | ICD-10-CM | POA: Diagnosis not present

## 2020-01-11 DIAGNOSIS — Z7189 Other specified counseling: Secondary | ICD-10-CM | POA: Diagnosis not present

## 2020-02-21 ENCOUNTER — Encounter: Payer: Self-pay | Admitting: Nurse Practitioner

## 2020-02-22 ENCOUNTER — Other Ambulatory Visit: Payer: Self-pay

## 2020-02-22 MED ORDER — CETIRIZINE HCL 10 MG PO TABS: 10.0000 mg | ORAL_TABLET | Freq: Every day | ORAL | 1 refills | Status: AC

## 2020-02-22 MED ORDER — OMEPRAZOLE 40 MG PO CPDR: 40.0000 mg | DELAYED_RELEASE_CAPSULE | Freq: Every day | ORAL | 1 refills | Status: DC

## 2020-03-03 DIAGNOSIS — Z124 Encounter for screening for malignant neoplasm of cervix: Secondary | ICD-10-CM | POA: Diagnosis not present

## 2020-03-03 DIAGNOSIS — Z113 Encounter for screening for infections with a predominantly sexual mode of transmission: Secondary | ICD-10-CM | POA: Diagnosis not present

## 2020-03-03 DIAGNOSIS — Z1389 Encounter for screening for other disorder: Secondary | ICD-10-CM | POA: Diagnosis not present

## 2020-03-03 DIAGNOSIS — Z6832 Body mass index (BMI) 32.0-32.9, adult: Secondary | ICD-10-CM | POA: Diagnosis not present

## 2020-03-03 DIAGNOSIS — Z1151 Encounter for screening for human papillomavirus (HPV): Secondary | ICD-10-CM | POA: Diagnosis not present

## 2020-03-03 DIAGNOSIS — Z01419 Encounter for gynecological examination (general) (routine) without abnormal findings: Secondary | ICD-10-CM | POA: Diagnosis not present

## 2020-03-03 LAB — HM PAP SMEAR

## 2020-04-07 ENCOUNTER — Other Ambulatory Visit: Payer: Self-pay | Admitting: Nurse Practitioner

## 2020-04-07 DIAGNOSIS — I10 Essential (primary) hypertension: Secondary | ICD-10-CM

## 2020-08-01 ENCOUNTER — Other Ambulatory Visit: Payer: Self-pay

## 2020-08-01 ENCOUNTER — Encounter: Payer: Self-pay | Admitting: Nurse Practitioner

## 2020-08-01 MED ORDER — FAMOTIDINE 20 MG PO TABS: 20.0000 mg | ORAL_TABLET | Freq: Two times a day (BID) | ORAL | 0 refills | Status: DC

## 2020-08-14 DIAGNOSIS — Z20828 Contact with and (suspected) exposure to other viral communicable diseases: Secondary | ICD-10-CM | POA: Diagnosis not present

## 2020-08-15 ENCOUNTER — Other Ambulatory Visit: Payer: Self-pay | Admitting: Nurse Practitioner

## 2020-08-31 ENCOUNTER — Encounter: Payer: Self-pay | Admitting: Nurse Practitioner

## 2020-08-31 ENCOUNTER — Ambulatory Visit: Payer: BLUE CROSS/BLUE SHIELD | Admitting: Nurse Practitioner

## 2020-08-31 ENCOUNTER — Other Ambulatory Visit: Payer: Self-pay

## 2020-08-31 VITALS — BP 112/80 | HR 70 | Temp 98.7°F | Ht 66.4 in | Wt 195.0 lb

## 2020-08-31 DIAGNOSIS — J069 Acute upper respiratory infection, unspecified: Secondary | ICD-10-CM | POA: Diagnosis not present

## 2020-08-31 DIAGNOSIS — J209 Acute bronchitis, unspecified: Secondary | ICD-10-CM

## 2020-08-31 DIAGNOSIS — I10 Essential (primary) hypertension: Secondary | ICD-10-CM

## 2020-08-31 NOTE — Patient Instructions (Signed)
Take HBP Coricidan brand cold medications due to history of hypertesion

## 2020-08-31 NOTE — Progress Notes (Signed)
I,Yamilka Roman [employer] as a Neurosurgeon for [employer], FNP.,have documented all relevant documentation on the behalf of Arnette Felts, FNP,as directed by  Arnette Felts, FNP while in the presence of Arnette Felts, FNP. This visit occurred during the SARS-CoV-2 public health emergency.  Safety protocols were in place, including screening questions prior to the visit, additional usage of staff PPE, and extensive cleaning of exam room while observing appropriate contact time as indicated for disinfecting solutions.  Subjective:     Patient ID: [name] , female    DOB: 08-11-1981 , 39 y.o.   MRN: [id number]   Chief Complaint  Patient presents with  . URI    patient stated she has had some chest and sinus congestion that started thursday. she stated her daughter had a cold. she stated she did have a covid test last monday and it came back negative.     HPI  She has been working from home since having cold symptoms. Since Thursday of last week.   URI  This is a new problem. The current episode started 1 to 4 weeks ago (3 weeks). The problem has been unchanged. There has been no fever. Associated symptoms include congestion, coughing and wheezing. Pertinent negatives include no abdominal pain, chest pain, dysuria, headaches, sinus pain or sore throat. Treatments tried: alka seltzer cold and flu.     Past Medical History:  Diagnosis Date  . Gestational hypertension      Family History  Problem Relation Age of Onset  . Hypertension Mother   . Hypertension Father   . Other Neg Hx      Current Outpatient Medications:  .  cetirizine (ZYRTEC) 10 MG tablet, Take 1 tablet (10 mg total) by mouth daily., Disp: 90 tablet, Rfl: 1 .  omeprazole (PRILOSEC) 40 MG capsule, TAKE 1 CAPSULE BY MOUTH EVERY DAY, Disp: 90 capsule, Rfl: 1 .  albuterol (VENTOLIN HFA) 108 (90 Base) MCG/ACT inhaler, Inhale 2 puffs into the lungs every 6 (six) hours as needed for wheezing or shortness of breath.,  Disp: 8 g, Rfl: 2 .  famotidine (PEPCID) 20 MG tablet, Take 1 tablet (20 mg total) by mouth 2 (two) times daily., Disp: 60 tablet, Rfl: 0 .  hydrochlorothiazide (HYDRODIURIL) 25 MG tablet, TAKE 1 TABLET BY MOUTH EVERY DAY, Disp: 90 tablet, Rfl: 1 .  predniSONE (STERAPRED UNI-PAK 21 TAB) 10 MG (21) TBPK tablet, Take as directed, Disp: 21 tablet, Rfl: 0   Allergies  Allergen Reactions  . Diflucan [Fluconazole] Other (See Comments)    Skin on her hand burned off.     Review of Systems  Constitutional: Negative.  Negative for fatigue.  HENT: Positive for congestion. Negative for sinus pain and sore throat.   Respiratory: Positive for cough and wheezing.   Cardiovascular: Negative for chest pain.  Gastrointestinal: Negative for abdominal pain.  Genitourinary: Negative for dysuria.  Neurological: Negative for headaches.  Psychiatric/Behavioral: Negative.      Today's Vitals   08/31/20 1454  BP: 112/80  Pulse: 70  Temp: 98.7 F (37.1 C)  TempSrc: Oral  Weight: 195 lb (88.5 kg)  Height: 5' 6.4" (1.687 m)  PainSc: 0-No pain   Body mass index is 31.1 kg/m.   Objective:  Physical Exam Vitals reviewed.  Constitutional:      General: She is not in acute distress.    Appearance: Normal appearance.  Cardiovascular:     Rate and Rhythm: Normal rate and regular rhythm.     Pulses: Normal  pulses.     Heart sounds: Normal heart sounds. No murmur heard.   Pulmonary:     Effort: Pulmonary effort is normal. No respiratory distress.     Breath sounds: Normal breath sounds. No wheezing.     Comments: Hacking cough Neurological:     General: No focal deficit present.     Mental Status: She is alert and oriented to person, place, and time.     Cranial Nerves: No cranial nerve deficit.  Psychiatric:        Mood and Affect: Mood normal.        Behavior: Behavior normal.        Thought Content: Thought content normal.        Judgment: Judgment normal.         Assessment And Plan:      1. Upper respiratory tract infection, unspecified type Will check covid test she is encouraged to remain in isolation until has a negative test - Novel Coronavirus, NAA ([facility])  2. Essential hypertension Chronic, well controlled She is to take HBP coricidan brand medication  3. Acute bronchitis, unspecified organism  Due to the length of time and persistent cough will treat with antibiotic and steroid taper - Novel Coronavirus, NAA ([facility])     Patient was given opportunity to ask questions. Patient verbalized understanding of the plan and was able to repeat key elements of the plan. All questions were answered to their satisfaction.  Arnette Felts, FNP   I, Arnette Felts, FNP, have reviewed all documentation for this visit. The documentation on 11/08/20 for the exam, diagnosis, procedures, and orders are all accurate and complete.  THE PATIENT IS ENCOURAGED TO PRACTICE SOCIAL DISTANCING DUE TO THE COVID-19 PANDEMIC.

## 2020-09-01 ENCOUNTER — Encounter: Payer: Self-pay | Admitting: Nurse Practitioner

## 2020-09-01 LAB — SARS-COV-2, NAA 2 DAY TAT

## 2020-09-01 LAB — NOVEL CORONAVIRUS, NAA: SARS-CoV-2, NAA: NOT DETECTED

## 2020-09-01 MED ORDER — PREDNISONE 10 MG (21) PO TBPK: ORAL_TABLET | ORAL | 0 refills | Status: DC

## 2020-09-01 MED ORDER — AZITHROMYCIN 250 MG PO TABS: ORAL_TABLET | ORAL | 0 refills | Status: AC

## 2020-09-14 ENCOUNTER — Other Ambulatory Visit: Payer: Self-pay | Admitting: Nurse Practitioner

## 2020-09-14 ENCOUNTER — Other Ambulatory Visit: Payer: Self-pay

## 2020-09-14 ENCOUNTER — Encounter: Payer: Self-pay | Admitting: Nurse Practitioner

## 2020-09-14 DIAGNOSIS — Z20822 Contact with and (suspected) exposure to covid-19: Secondary | ICD-10-CM | POA: Diagnosis not present

## 2020-09-14 DIAGNOSIS — R053 Chronic cough: Secondary | ICD-10-CM

## 2020-09-14 MED ORDER — ALBUTEROL SULFATE HFA 108 (90 BASE) MCG/ACT IN AERS: 2.0000 | INHALATION_SPRAY | Freq: Four times a day (QID) | RESPIRATORY_TRACT | 2 refills | Status: DC | PRN

## 2020-09-15 LAB — NOVEL CORONAVIRUS, NAA: SARS-CoV-2, NAA: NOT DETECTED

## 2020-09-15 LAB — SARS-COV-2, NAA 2 DAY TAT

## 2020-10-05 ENCOUNTER — Other Ambulatory Visit: Payer: Self-pay | Admitting: Nurse Practitioner

## 2020-10-05 DIAGNOSIS — I10 Essential (primary) hypertension: Secondary | ICD-10-CM

## 2020-10-18 ENCOUNTER — Other Ambulatory Visit: Payer: Self-pay

## 2020-10-18 ENCOUNTER — Encounter: Payer: Self-pay | Admitting: Nurse Practitioner

## 2020-10-18 MED ORDER — FAMOTIDINE 20 MG PO TABS: 20.0000 mg | ORAL_TABLET | Freq: Two times a day (BID) | ORAL | 0 refills | Status: DC

## 2020-11-01 DIAGNOSIS — Z03818 Encounter for observation for suspected exposure to other biological agents ruled out: Secondary | ICD-10-CM | POA: Diagnosis not present

## 2020-11-06 ENCOUNTER — Encounter: Payer: BLUE CROSS/BLUE SHIELD | Admitting: Nurse Practitioner

## 2020-11-06 DIAGNOSIS — Z03818 Encounter for observation for suspected exposure to other biological agents ruled out: Secondary | ICD-10-CM | POA: Diagnosis not present

## 2020-11-16 ENCOUNTER — Other Ambulatory Visit: Payer: Self-pay | Admitting: Nurse Practitioner

## 2020-11-16 DIAGNOSIS — K219 Gastro-esophageal reflux disease without esophagitis: Secondary | ICD-10-CM

## 2020-11-18 DIAGNOSIS — U071 COVID-19: Secondary | ICD-10-CM | POA: Diagnosis not present

## 2020-11-18 DIAGNOSIS — Z20828 Contact with and (suspected) exposure to other viral communicable diseases: Secondary | ICD-10-CM | POA: Diagnosis not present

## 2020-12-01 DIAGNOSIS — Z20822 Contact with and (suspected) exposure to covid-19: Secondary | ICD-10-CM | POA: Diagnosis not present

## 2020-12-05 DIAGNOSIS — Z23 Encounter for immunization: Secondary | ICD-10-CM | POA: Diagnosis not present

## 2020-12-13 ENCOUNTER — Encounter: Payer: Self-pay | Admitting: Nurse Practitioner

## 2020-12-13 ENCOUNTER — Other Ambulatory Visit: Payer: Self-pay

## 2020-12-13 NOTE — Telephone Encounter (Signed)
error 

## 2020-12-15 ENCOUNTER — Other Ambulatory Visit: Payer: Self-pay

## 2020-12-15 ENCOUNTER — Other Ambulatory Visit: Payer: Self-pay | Admitting: Nurse Practitioner

## 2020-12-15 MED ORDER — OMEPRAZOLE 40 MG PO CPDR: DELAYED_RELEASE_CAPSULE | ORAL | 0 refills | Status: DC

## 2020-12-26 ENCOUNTER — Other Ambulatory Visit: Payer: Self-pay | Admitting: Nurse Practitioner

## 2020-12-26 ENCOUNTER — Encounter: Payer: Self-pay | Admitting: Nurse Practitioner

## 2020-12-26 ENCOUNTER — Ambulatory Visit (INDEPENDENT_AMBULATORY_CARE_PROVIDER_SITE_OTHER): Payer: BLUE CROSS/BLUE SHIELD | Admitting: Nurse Practitioner

## 2020-12-26 ENCOUNTER — Other Ambulatory Visit: Payer: Self-pay

## 2020-12-26 VITALS — BP 116/78 | HR 67 | Temp 98.8°F | Ht 66.4 in | Wt 201.6 lb

## 2020-12-26 DIAGNOSIS — N6310 Unspecified lump in the right breast, unspecified quadrant: Secondary | ICD-10-CM

## 2020-12-26 DIAGNOSIS — Z0000 Encounter for general adult medical examination without abnormal findings: Secondary | ICD-10-CM | POA: Diagnosis not present

## 2020-12-26 DIAGNOSIS — I10 Essential (primary) hypertension: Secondary | ICD-10-CM

## 2020-12-26 DIAGNOSIS — Z1159 Encounter for screening for other viral diseases: Secondary | ICD-10-CM | POA: Diagnosis not present

## 2020-12-26 DIAGNOSIS — R7309 Other abnormal glucose: Secondary | ICD-10-CM | POA: Diagnosis not present

## 2020-12-26 DIAGNOSIS — Z8616 Personal history of COVID-19: Secondary | ICD-10-CM

## 2020-12-26 DIAGNOSIS — E669 Obesity, unspecified: Secondary | ICD-10-CM

## 2020-12-26 DIAGNOSIS — E559 Vitamin D deficiency, unspecified: Secondary | ICD-10-CM

## 2020-12-26 DIAGNOSIS — Z6832 Body mass index (BMI) 32.0-32.9, adult: Secondary | ICD-10-CM | POA: Diagnosis not present

## 2020-12-26 DIAGNOSIS — K219 Gastro-esophageal reflux disease without esophagitis: Secondary | ICD-10-CM

## 2020-12-26 LAB — POCT URINALYSIS DIPSTICK
Bilirubin, UA: NEGATIVE
Glucose, UA: NEGATIVE
Ketones, UA: NEGATIVE
Leukocytes, UA: NEGATIVE
Nitrite, UA: NEGATIVE
Protein, UA: NEGATIVE
Spec Grav, UA: 1.025 (ref 1.010–1.025)
Urobilinogen, UA: 0.2 E.U./dL
pH, UA: 7 (ref 5.0–8.0)

## 2020-12-26 LAB — POCT UA - MICROALBUMIN
Albumin/Creatinine Ratio, Urine, POC: 30
Creatinine, POC: 300 mg/dL
Microalbumin Ur, POC: 30 mg/L

## 2020-12-26 MED ORDER — ESOMEPRAZOLE MAGNESIUM 20 MG PO CPDR
20.0000 mg | DELAYED_RELEASE_CAPSULE | Freq: Every day | ORAL | 1 refills | Status: DC
Start: 2020-12-26 — End: 2021-02-12

## 2020-12-26 MED ORDER — FAMOTIDINE 20 MG PO TABS: 20.0000 mg | ORAL_TABLET | Freq: Every day | ORAL | 0 refills | Status: DC

## 2020-12-26 NOTE — Progress Notes (Signed)
I,Yamilka Roman [employer] as a Education administrator for [employer], FNP.,have documented all relevant documentation on the behalf of Minette Brine, FNP,as directed by  Minette Brine, FNP while in the presence of Minette Brine, [place]. This visit occurred during the SARS-CoV-2 public health emergency.  Safety protocols were in place, including screening questions prior to the visit, additional usage of staff PPE, and extensive cleaning of exam room while observing appropriate contact time as indicated for disinfecting solutions.  Subjective:     Patient ID: [name] , female    DOB: 11-14-1980 , 40 y.o.   MRN: [id number]   Chief Complaint  Patient presents with  . Annual Exam    HPI  Here for hm.  She has been exercising and eating better over the last 3 weeks. Her weight was 204 - 206 lbs. She is taking omeprazole and pepcid in the evening. She has been to GI and needs to follow up.   Wt Readings from Last 3 Encounters: 12/26/20 : 201 lb 9.6 oz (91.4 kg) 08/31/20 : 195 lb (88.5 kg) 09/29/19 : 201 lb 9.6 oz (91.4 kg)    Past Medical History:  Diagnosis Date  . Gestational hypertension      Family History  Problem Relation Age of Onset  . Hypertension Mother   . Hypertension Father   . Other Neg Hx      Current Outpatient Medications:  .  cetirizine (ZYRTEC) 10 MG tablet, Take 1 tablet (10 mg total) by mouth daily., Disp: 90 tablet, Rfl: 1 .  esomeprazole (NEXIUM) 20 MG capsule, Take 1 capsule (20 mg total) by mouth daily at 12 noon., Disp: 90 capsule, Rfl: 1 .  hydrochlorothiazide (HYDRODIURIL) 25 MG tablet, TAKE 1 TABLET BY MOUTH EVERY DAY, Disp: 90 tablet, Rfl: 1 .  famotidine (PEPCID) 20 MG tablet, Take 1 tablet (20 mg total) by mouth daily., Disp: 60 tablet, Rfl: 0 .  Vitamin D, Ergocalciferol, (DRISDOL) 1.25 MG (50000 UNIT) CAPS capsule, Take 1 capsule (50,000 Units total) by mouth 2 (two) times a week., Disp: 24 capsule, Rfl: 1   Allergies  Allergen Reactions  . Diflucan  [Fluconazole] Other (See Comments)    Skin on her hand burned off.      The patient states she uses IUD for birth control.Negative for Dysmenorrhea and Negative for Menorrhagia. Negative for: breast discharge, breast lump(s), breast pain and breast self exam. Associated symptoms include abnormal vaginal bleeding. Pertinent negatives include abnormal bleeding (hematology), anxiety, decreased libido, depression, difficulty falling sleep, dyspareunia, history of infertility, nocturia, sexual dysfunction, sleep disturbances, urinary incontinence, urinary urgency, vaginal discharge and vaginal itching. Diet regular; limited sweets The patient states her exercise level is minimal.    The patient's tobacco use is:  Social History   Tobacco Use  Smoking Status Never Smoker  Smokeless Tobacco Never Used   She has been exposed to passive smoke. The patient's alcohol use is:  Social History   Substance and Sexual Activity  Alcohol Use No   Additional information: Last pap 2021 per patient with Dr. Melba Coon will get record.    Review of Systems  Constitutional: Negative.   HENT: Negative.   Eyes: Negative.   Respiratory: Negative.   Cardiovascular: Negative.  Negative for chest pain, palpitations and leg swelling.  Gastrointestinal: Negative.   Endocrine: Negative.   Genitourinary: Negative.   Musculoskeletal: Negative.   Skin: Negative.   Allergic/Immunologic: Negative.   Neurological: Negative.   Hematological: Negative.   Psychiatric/Behavioral: Negative.  Today's Vitals   12/26/20 0957  BP: 116/78  Pulse: 67  Temp: 98.8 F (37.1 C)  TempSrc: Oral  SpO2: 100%  Weight: 201 lb 9.6 oz (91.4 kg)  Height: 5' 6.4" (1.687 m)  PainSc: 0-No pain   Body mass index is 32.15 kg/m.   Objective:  Physical Exam Constitutional:      General: She is not in acute distress.    Appearance: Normal appearance. She is well-developed. She is obese.  HENT:     Head: Normocephalic and  atraumatic.     Right Ear: Hearing, tympanic membrane, ear canal and external ear normal. There is no impacted cerumen.     Left Ear: Hearing, tympanic membrane, ear canal and external ear normal. There is no impacted cerumen.     Nose:     Comments: Deferred - masked    Mouth/Throat:     Comments: Deferred - masked Eyes:     General: Lids are normal.     Extraocular Movements: Extraocular movements intact.     Conjunctiva/sclera: Conjunctivae normal.     Pupils: Pupils are equal, round, and reactive to light.     Funduscopic exam:    Right eye: No papilledema.        Left eye: No papilledema.  Neck:     Thyroid: No thyroid mass.     Vascular: No carotid bruit.  Cardiovascular:     Rate and Rhythm: Normal rate and regular rhythm.     Pulses: Normal pulses.     Heart sounds: Normal heart sounds. No murmur heard.   Pulmonary:     Effort: Pulmonary effort is normal. No respiratory distress.     Breath sounds: Normal breath sounds. No wheezing.  Chest:     Chest wall: No mass.  Breasts:     Tanner Score is 5.     Right: Mass present. No tenderness, axillary adenopathy or supraclavicular adenopathy.     Left: Normal. No mass, tenderness, axillary adenopathy or supraclavicular adenopathy.        Comments: Lump palpated to right lateral breast at 10 o'clock Abdominal:     General: Abdomen is flat. Bowel sounds are normal. There is no distension.     Palpations: Abdomen is soft.     Tenderness: There is no abdominal tenderness.  Genitourinary:    Rectum: Guaiac result negative.  Musculoskeletal:        General: No swelling. Normal range of motion.     Cervical back: Full passive range of motion without pain, normal range of motion and neck supple.     Right lower leg: No edema.     Left lower leg: No edema.  Lymphadenopathy:     Upper Body:     Right upper body: No supraclavicular, axillary or pectoral adenopathy.     Left upper body: No supraclavicular, axillary or  pectoral adenopathy.  Skin:    General: Skin is warm and dry.     Capillary Refill: Capillary refill takes less than 2 seconds.  Neurological:     General: No focal deficit present.     Mental Status: She is alert and oriented to person, place, and time.     Cranial Nerves: No cranial nerve deficit.     Sensory: No sensory deficit.     Motor: No weakness.  Psychiatric:        Mood and Affect: Mood normal.        Behavior: Behavior normal.  Thought Content: Thought content normal.        Judgment: Judgment normal.         Assessment And Plan:     1. Encounter for general adult medical examination w/o abnormal findings . Behavior modifications discussed and diet history reviewed.   . Pt will continue to exercise regularly and modify diet with low GI, plant based foods and decrease intake of processed foods.  . Recommend intake of daily multivitamin, Vitamin D, and calcium.  . Recommend mammogram (will get diagnostic due to lump palpable) for preventive screenings, as well as recommend immunizations that include influenza, TDAP - CBC  2. Essential hypertension . B/P is well controlled.  . CMP ordered to check renal function.  . The importance of regular exercise and dietary modification was stressed to the patient.  . Stressed importance of losing ten percent of her body weight to help with B/P control.  . The weight loss would help with decreasing cardiac and cancer risk as well.  . EKG done with SR - bradycardia HR 55 - POCT Urinalysis Dipstick (81002) - POCT UA - Microalbumin - EKG 12-Lead - CMP14+EGFR  3. Class 1 obesity without serious comorbidity with body mass index (BMI) of 32.0 to 32.9 in adult, unspecified obesity type  Chronic, encouraged to increase physical activity.   Long discussion about weight loss, will refer to Healthy weight and wellness in future if patient is interested.  - Insulin, random - TSH  4. Abnormal glucose  Chronic, controlled  No  current medications  5. Vitamin D deficiency  Will check vitamin D level and supplement as needed.     Also encouraged to spend 15 minutes in the sun daily.  - VITAMIN D 25 Hydroxy (Vit-D Deficiency, Fractures)  6. Gastroesophageal reflux disease without esophagitis  Chronic, controlled  Continue with current medications - famotidine (PEPCID) 20 MG tablet; Take 1 tablet (20 mg total) by mouth daily.  Dispense: 60 tablet; Refill: 0 - esomeprazole (NEXIUM) 20 MG capsule; Take 1 capsule (20 mg total) by mouth daily at 12 noon.  Dispense: 90 capsule; Refill: 1  7. Lump of right breast Palpable lump palpated to right breast at 10 o'clock  8. Encounter for hepatitis C screening test for low risk patient  Will check Hepatitis C screening due to recent recommendations to screen all adults 18 years and older - Hepatitis C antibody  9. History of COVID-19     Patient was given opportunity to ask questions. Patient verbalized understanding of the plan and was able to repeat key elements of the plan. All questions were answered to their satisfaction.   Minette Brine, FNP   I, Minette Brine, FNP, have reviewed all documentation for this visit. The documentation on 12/26/20 for the exam, diagnosis, procedures, and orders are all accurate and complete.   THE PATIENT IS ENCOURAGED TO PRACTICE SOCIAL DISTANCING DUE TO THE COVID-19 PANDEMIC.

## 2020-12-26 NOTE — Patient Instructions (Signed)
Health Maintenance, Female Adopting a healthy lifestyle and getting preventive care are important in promoting health and wellness. Ask your health care provider about:  The right schedule for you to have regular tests and exams.  Things you can do on your own to prevent diseases and keep yourself healthy. What should I know about diet, weight, and exercise? Eat a healthy diet  Eat a diet that includes plenty of vegetables, fruits, low-fat dairy products, and lean protein.  Do not eat a lot of foods that are high in solid fats, added sugars, or sodium.   Maintain a healthy weight Body mass index (BMI) is used to identify weight problems. It estimates body fat based on height and weight. Your health care provider can help determine your BMI and help you achieve or maintain a healthy weight. Get regular exercise Get regular exercise. This is one of the most important things you can do for your health. Most adults should:  Exercise for at least 150 minutes each week. The exercise should increase your heart rate and make you sweat (moderate-intensity exercise).  Do strengthening exercises at least twice a week. This is in addition to the moderate-intensity exercise.  Spend less time sitting. Even light physical activity can be beneficial. Watch cholesterol and blood lipids Have your blood tested for lipids and cholesterol at 39 years of age, then have this test every 5 years. Have your cholesterol levels checked more often if:  Your lipid or cholesterol levels are high.  You are older than 40 years of age.  You are at high risk for heart disease. What should I know about cancer screening? Depending on your health history and family history, you may need to have cancer screening at various ages. This may include screening for:  Breast cancer.  Cervical cancer.  Colorectal cancer.  Skin cancer.  Lung cancer. What should I know about heart disease, diabetes, and high blood  pressure? Blood pressure and heart disease  High blood pressure causes heart disease and increases the risk of stroke. This is more likely to develop in people who have high blood pressure readings, are of African descent, or are overweight.  Have your blood pressure checked: ? Every 3-5 years if you are 18-39 years of age. ? Every year if you are 40 years old or older. Diabetes Have regular diabetes screenings. This checks your fasting blood sugar level. Have the screening done:  Once every three years after age 40 if you are at a normal weight and have a low risk for diabetes.  More often and at a younger age if you are overweight or have a high risk for diabetes. What should I know about preventing infection? Hepatitis B If you have a higher risk for hepatitis B, you should be screened for this virus. Talk with your health care provider to find out if you are at risk for hepatitis B infection. Hepatitis C Testing is recommended for:  Everyone born from 1945 through 1965.  Anyone with known risk factors for hepatitis C. Sexually transmitted infections (STIs)  Get screened for STIs, including gonorrhea and chlamydia, if: ? You are sexually active and are younger than 39 years of age. ? You are older than 39 years of age and your health care provider tells you that you are at risk for this type of infection. ? Your sexual activity has changed since you were last screened, and you are at increased risk for chlamydia or gonorrhea. Ask your health care provider   if you are at risk.  Ask your health care provider about whether you are at high risk for HIV. Your health care provider may recommend a prescription medicine to help prevent HIV infection. If you choose to take medicine to prevent HIV, you should first get tested for HIV. You should then be tested every 3 months for as long as you are taking the medicine. Pregnancy  If you are about to stop having your period (premenopausal) and  you may become pregnant, seek counseling before you get pregnant.  Take 400 to 800 micrograms (mcg) of folic acid every day if you become pregnant.  Ask for birth control (contraception) if you want to prevent pregnancy. Osteoporosis and menopause Osteoporosis is a disease in which the bones lose minerals and strength with aging. This can result in bone fractures. If you are 65 years old or older, or if you are at risk for osteoporosis and fractures, ask your health care provider if you should:  Be screened for bone loss.  Take a calcium or vitamin D supplement to lower your risk of fractures.  Be given hormone replacement therapy (HRT) to treat symptoms of menopause. Follow these instructions at home: Lifestyle  Do not use any products that contain nicotine or tobacco, such as cigarettes, e-cigarettes, and chewing tobacco. If you need help quitting, ask your health care provider.  Do not use street drugs.  Do not share needles.  Ask your health care provider for help if you need support or information about quitting drugs. Alcohol use  Do not drink alcohol if: ? Your health care provider tells you not to drink. ? You are pregnant, may be pregnant, or are planning to become pregnant.  If you drink alcohol: ? Limit how much you use to 0-1 drink a day. ? Limit intake if you are breastfeeding.  Be aware of how much alcohol is in your drink. In the [place] one drink equals one 12 oz bottle of beer (355 mL), one 5 oz glass of wine (148 mL), or one 1 oz glass of hard liquor (44 mL). General instructions  Schedule regular health, dental, and eye exams.  Stay current with your vaccines.  Tell your health care provider if: ? You often feel depressed. ? You have ever been abused or do not feel safe at home. Summary  Adopting a healthy lifestyle and getting preventive care are important in promoting health and wellness.  Follow your health care provider's instructions about healthy  diet, exercising, and getting tested or screened for diseases.  Follow your health care provider's instructions on monitoring your cholesterol and blood pressure. This information is not intended to replace advice given to you by your health care provider. Make sure you discuss any questions you have with your health care provider. Document Revised: 10/21/2018 Document Reviewed: 10/21/2018 Elsevier Patient Education  2021 Elsevier Inc.  

## 2020-12-27 ENCOUNTER — Other Ambulatory Visit: Payer: Self-pay | Admitting: Nurse Practitioner

## 2020-12-27 ENCOUNTER — Encounter: Payer: Self-pay | Admitting: Nurse Practitioner

## 2020-12-27 DIAGNOSIS — E559 Vitamin D deficiency, unspecified: Secondary | ICD-10-CM

## 2020-12-27 DIAGNOSIS — N6310 Unspecified lump in the right breast, unspecified quadrant: Secondary | ICD-10-CM

## 2020-12-27 LAB — VITAMIN D 25 HYDROXY (VIT D DEFICIENCY, FRACTURES): Vit D, 25-Hydroxy: 6.6 ng/mL — ABNORMAL LOW (ref 30.0–100.0)

## 2020-12-27 LAB — TSH: TSH: 2.75 u[IU]/mL (ref 0.450–4.500)

## 2020-12-27 LAB — CMP14+EGFR
ALT: 31 IU/L (ref 0–32)
AST: 51 IU/L — ABNORMAL HIGH (ref 0–40)
Albumin/Globulin Ratio: 1.8 (ref 1.2–2.2)
Albumin: 4.7 g/dL (ref 3.8–4.8)
Alkaline Phosphatase: 52 IU/L (ref 44–121)
BUN/Creatinine Ratio: 10 (ref 9–23)
BUN: 10 mg/dL (ref 6–20)
Bilirubin Total: 0.5 mg/dL (ref 0.0–1.2)
CO2: 22 mmol/L (ref 20–29)
Calcium: 10.1 mg/dL (ref 8.7–10.2)
Chloride: 99 mmol/L (ref 96–106)
Creatinine, Ser: 1.05 mg/dL — ABNORMAL HIGH (ref 0.57–1.00)
GFR calc Af Amer: 78 mL/min/{1.73_m2} (ref >59)
GFR calc non Af Amer: 68 mL/min/{1.73_m2} (ref >59)
Globulin, Total: 2.6 g/dL (ref 1.5–4.5)
Glucose: 81 mg/dL (ref 65–99)
Potassium: 3.9 mmol/L (ref 3.5–5.2)
Sodium: 139 mmol/L (ref 134–144)
Total Protein: 7.3 g/dL (ref 6.0–8.5)

## 2020-12-27 LAB — INSULIN, RANDOM: INSULIN: 5.4 u[IU]/mL (ref 2.6–24.9)

## 2020-12-27 LAB — CBC
Hematocrit: 39.7 % (ref 34.0–46.6)
Hemoglobin: 13.1 g/dL (ref 11.1–15.9)
MCH: 30.3 pg (ref 26.6–33.0)
MCHC: 33 g/dL (ref 31.5–35.7)
MCV: 92 fL (ref 79–97)
Platelets: 311 10*3/uL (ref 150–450)
RBC: 4.33 x10E6/uL (ref 3.77–5.28)
RDW: 12.3 % (ref 11.7–15.4)
WBC: 4.6 10*3/uL (ref 3.4–10.8)

## 2020-12-27 LAB — HEPATITIS C ANTIBODY: Hep C Virus Ab: <0.1 s/co ratio (ref 0.0–0.9)

## 2020-12-27 MED ORDER — VITAMIN D (ERGOCALCIFEROL) 1.25 MG (50000 UNIT) PO CAPS: 50000.0000 [IU] | ORAL_CAPSULE | ORAL | 1 refills | Status: DC

## 2020-12-28 ENCOUNTER — Encounter: Payer: Self-pay | Admitting: Nurse Practitioner

## 2021-01-03 ENCOUNTER — Ambulatory Visit
Admission: RE | Admit: 2021-01-03 | Discharge: 2021-01-03 | Disposition: A | Discharge status: Home / Self Care | Payer: BLUE CROSS/BLUE SHIELD | Source: Ambulatory Visit | Attending: Nurse Practitioner | Admitting: Nurse Practitioner

## 2021-01-03 ENCOUNTER — Other Ambulatory Visit: Payer: Self-pay

## 2021-01-03 DIAGNOSIS — N6489 Other specified disorders of breast: Secondary | ICD-10-CM | POA: Diagnosis not present

## 2021-01-03 DIAGNOSIS — R922 Inconclusive mammogram: Secondary | ICD-10-CM | POA: Diagnosis not present

## 2021-01-03 DIAGNOSIS — N6310 Unspecified lump in the right breast, unspecified quadrant: Secondary | ICD-10-CM

## 2021-02-05 ENCOUNTER — Encounter: Payer: Self-pay | Admitting: Nurse Practitioner

## 2021-02-07 ENCOUNTER — Ambulatory Visit: Payer: BLUE CROSS/BLUE SHIELD | Admitting: Nurse Practitioner

## 2021-02-08 ENCOUNTER — Other Ambulatory Visit: Payer: BLUE CROSS/BLUE SHIELD

## 2021-02-12 ENCOUNTER — Encounter: Payer: Self-pay | Admitting: Nurse Practitioner

## 2021-02-12 ENCOUNTER — Other Ambulatory Visit: Payer: Self-pay

## 2021-02-12 ENCOUNTER — Ambulatory Visit: Payer: BLUE CROSS/BLUE SHIELD | Admitting: Nurse Practitioner

## 2021-02-12 VITALS — BP 122/80 | HR 65 | Temp 98.2°F | Ht 64.0 in | Wt 200.4 lb

## 2021-02-12 DIAGNOSIS — K219 Gastro-esophageal reflux disease without esophagitis: Secondary | ICD-10-CM

## 2021-02-12 DIAGNOSIS — R41840 Attention and concentration deficit: Secondary | ICD-10-CM

## 2021-02-12 NOTE — Progress Notes (Signed)
I,Yamilka Roman [employer] as a Neurosurgeon for [employer], FNP.,have documented all relevant documentation on the behalf of Arnette Felts, FNP,as directed by  Arnette Felts, FNP while in the presence of Arnette Felts, FNP. This visit occurred during the SARS-CoV-2 public health emergency.  Safety protocols were in place, including screening questions prior to the visit, additional usage of staff PPE, and extensive cleaning of exam room while observing appropriate contact time as indicated for disinfecting solutions.  Subjective:     Patient ID: [name] , female    DOB: 22-Dec-1980 , 40 y.o.   MRN: [id number]   Chief Complaint  Patient presents with  . concentrating difficulties    Patient would like to discuss starting meds for ADHD    HPI  Patient presents today for a 6 week f/u on her nexium. Patient did not start the nexium, just changed her diet and is doing better. She is also exercising 5-6 days a week.  She continues to take prilosec.    She is concerned she has ADHD, feels like she has always had it. Struggle with keeping things together and organized and forgetting. She noticed in the 4th grade. She would like to do something about it.   Wt Readings from Last 3 Encounters: 02/12/21 : 200 lb 6.4 oz (90.9 kg) 12/26/20 : 201 lb 9.6 oz (91.4 kg) 08/31/20 : 195 lb (88.5 kg)       Past Medical History:  Diagnosis Date  . Gestational hypertension      Family History  Problem Relation Age of Onset  . Hypertension Mother   . Hypertension Father   . Other Neg Hx      Current Outpatient Medications:  .  cetirizine (ZYRTEC) 10 MG tablet, Take 1 tablet (10 mg total) by mouth daily., Disp: 90 tablet, Rfl: 1 .  hydrochlorothiazide (HYDRODIURIL) 25 MG tablet, TAKE 1 TABLET BY MOUTH EVERY DAY, Disp: 90 tablet, Rfl: 1 .  omeprazole (PRILOSEC) 40 MG capsule, Take 40 mg by mouth daily., Disp: , Rfl:  .  Vitamin D, Ergocalciferol, (DRISDOL) 1.25 MG (50000 UNIT) CAPS capsule,  Take 1 capsule (50,000 Units total) by mouth 2 (two) times a week., Disp: 24 capsule, Rfl: 1   Allergies  Allergen Reactions  . Diflucan [Fluconazole] Other (See Comments)    Skin on her hand burned off.     Review of Systems  Constitutional: Negative.  Negative for fatigue.  HENT: Negative.   Eyes: Negative.   Respiratory: Negative.   Cardiovascular: Negative.  Negative for chest pain, palpitations and leg swelling.  Gastrointestinal: Negative.   Endocrine: Negative.   Genitourinary: Negative.   Musculoskeletal: Negative.   Skin: Negative.   Allergic/Immunologic: Negative.   Neurological: Negative.   Hematological: Negative.   Psychiatric/Behavioral: Negative.        Concentration problems     Today's Vitals   02/12/21 1615  BP: 122/80  Pulse: 65  Temp: 98.2 F (36.8 C)  TempSrc: Oral  Weight: 200 lb 6.4 oz (90.9 kg)  Height: 5\' 4"  (1.626 m)  PainSc: 0-No pain   Body mass index is 34.4 kg/m.   Objective:  Physical Exam Vitals reviewed.  Constitutional:      General: She is not in acute distress.    Appearance: Normal appearance.  Cardiovascular:     Rate and Rhythm: Normal rate and regular rhythm.     Pulses: Normal pulses.     Heart sounds: Normal heart sounds. No murmur heard.   Pulmonary:  Effort: Pulmonary effort is normal. No respiratory distress.     Breath sounds: Normal breath sounds. No wheezing.  Neurological:     General: No focal deficit present.     Mental Status: She is alert and oriented to person, place, and time.     Cranial Nerves: No cranial nerve deficit.  Psychiatric:        Mood and Affect: Mood normal.        Behavior: Behavior normal.        Thought Content: Thought content normal.        Judgment: Judgment normal.         Assessment And Plan:     1. Gastroesophageal reflux disease without esophagitis  She is doing okay without the nexium she changed her diet  She is encouraged to continue what she is doing  2.  Lack of concentration  Will refer to psychiatry for further evaluation  Her concentration screening had several often answers - Ambulatory referral to Psychiatry     Patient was given opportunity to ask questions. Patient verbalized understanding of the plan and was able to repeat key elements of the plan. All questions were answered to their satisfaction.  Arnette Felts, FNP   I, Arnette Felts, FNP, have reviewed all documentation for this visit. The documentation on 02/12/21 for the exam, diagnosis, procedures, and orders are all accurate and complete.   IF YOU HAVE BEEN REFERRED TO A SPECIALIST, IT MAY TAKE 1-2 WEEKS TO SCHEDULE/PROCESS THE REFERRAL. IF YOU HAVE NOT HEARD FROM US/SPECIALIST IN TWO WEEKS, PLEASE GIVE [place] A CALL AT [phone] X [phone].   THE PATIENT IS ENCOURAGED TO PRACTICE SOCIAL DISTANCING DUE TO THE COVID-19 PANDEMIC.

## 2021-02-18 ENCOUNTER — Encounter: Payer: Self-pay | Admitting: Nurse Practitioner

## 2021-02-23 DIAGNOSIS — Z03818 Encounter for observation for suspected exposure to other biological agents ruled out: Secondary | ICD-10-CM | POA: Diagnosis not present

## 2021-03-12 ENCOUNTER — Encounter: Payer: Self-pay | Admitting: Nurse Practitioner

## 2021-03-12 DIAGNOSIS — F411 Generalized anxiety disorder: Secondary | ICD-10-CM | POA: Diagnosis not present

## 2021-03-30 ENCOUNTER — Other Ambulatory Visit: Payer: Self-pay | Admitting: Nurse Practitioner

## 2021-04-12 DIAGNOSIS — Z03818 Encounter for observation for suspected exposure to other biological agents ruled out: Secondary | ICD-10-CM | POA: Diagnosis not present

## 2021-04-12 DIAGNOSIS — Z20822 Contact with and (suspected) exposure to covid-19: Secondary | ICD-10-CM | POA: Diagnosis not present

## 2021-04-16 ENCOUNTER — Other Ambulatory Visit: Payer: Self-pay | Admitting: Nurse Practitioner

## 2021-04-16 DIAGNOSIS — I10 Essential (primary) hypertension: Secondary | ICD-10-CM

## 2021-04-16 DIAGNOSIS — Z03818 Encounter for observation for suspected exposure to other biological agents ruled out: Secondary | ICD-10-CM | POA: Diagnosis not present

## 2021-06-11 ENCOUNTER — Ambulatory Visit: Payer: BLUE CROSS/BLUE SHIELD | Admitting: Nurse Practitioner

## 2021-06-22 ENCOUNTER — Encounter: Payer: Self-pay | Admitting: Nurse Practitioner

## 2021-06-22 DIAGNOSIS — M79671 Pain in right foot: Secondary | ICD-10-CM | POA: Diagnosis not present

## 2021-06-25 ENCOUNTER — Ambulatory Visit: Payer: BLUE CROSS/BLUE SHIELD | Admitting: Nurse Practitioner

## 2021-07-10 ENCOUNTER — Encounter: Payer: Self-pay | Admitting: Nurse Practitioner

## 2021-07-10 ENCOUNTER — Ambulatory Visit: Payer: BLUE CROSS/BLUE SHIELD | Admitting: Nurse Practitioner

## 2021-07-10 ENCOUNTER — Other Ambulatory Visit: Payer: Self-pay

## 2021-07-10 VITALS — BP 132/88 | HR 72 | Temp 98.4°F | Ht 64.0 in | Wt 207.8 lb

## 2021-07-10 DIAGNOSIS — I10 Essential (primary) hypertension: Secondary | ICD-10-CM | POA: Diagnosis not present

## 2021-07-10 DIAGNOSIS — B373 Candidiasis of vulva and vagina: Secondary | ICD-10-CM | POA: Diagnosis not present

## 2021-07-10 DIAGNOSIS — Z6835 Body mass index (BMI) 35.0-35.9, adult: Secondary | ICD-10-CM | POA: Diagnosis not present

## 2021-07-10 NOTE — Progress Notes (Signed)
I,Tianna Badgett,acting as a Neurosurgeon for [employer], NP.,have documented all relevant documentation on the behalf of [employer], NP,as directed by  Charlesetta Ivory, NP while in the presence of Charlesetta Ivory, NP.  This visit occurred during the SARS-CoV-2 public health emergency.  Safety protocols were in place, including screening questions prior to the visit, additional usage of staff PPE, and extensive cleaning of exam room while observing appropriate contact time as indicated for disinfecting solutions.  Subjective:     Patient ID: [name] , female    DOB: 05-10-1981 , 40 y.o.   MRN: [id number]   Chief Complaint  Patient presents with   Hypertension    HPI  Patient is here for elevated bp. She went to the GYN today and was told her bp was 140/100 and then when retested it was 140/96. Today her BP is 132/88. Patient would like to hold off on medication right now. She wants to try lifestyle modification such as reducing stress, eating better and exercising to see if it helps. She will return in 1 week for a nurse visit and bring her BP cuff with her to check what her BP. Denies chest pain, SOB. She does have history of HA.   Hypertension This is a chronic problem. Associated symptoms include headaches. Pertinent negatives include no chest pain, palpitations or shortness of breath.    Past Medical History:  Diagnosis Date   Gestational hypertension      Family History  Problem Relation Age of Onset   Hypertension Mother    Hypertension Father    Other Neg Hx      Current Outpatient Medications:    cetirizine (ZYRTEC) 10 MG tablet, Take 1 tablet (10 mg total) by mouth daily., Disp: 90 tablet, Rfl: 1   hydrochlorothiazide (HYDRODIURIL) 25 MG tablet, TAKE 1 TABLET BY MOUTH EVERY DAY, Disp: 90 tablet, Rfl: 1   omeprazole (PRILOSEC) 40 MG capsule, TAKE 1 CAPSULE BY MOUTH EVERY DAY, Disp: 90 capsule, Rfl: 1   Vitamin D, Ergocalciferol, (DRISDOL) 1.25 MG  (50000 UNIT) CAPS capsule, Take 1 capsule (50,000 Units total) by mouth 2 (two) times a week., Disp: 24 capsule, Rfl: 1   Allergies  Allergen Reactions   Diflucan [Fluconazole] Other (See Comments)    Skin on her hand burned off.     Review of Systems  Constitutional: Negative.  Negative for chills and fever.  HENT:  Negative for congestion, sinus pressure and sinus pain.   Respiratory: Negative.  Negative for cough, shortness of breath and wheezing.   Cardiovascular: Negative.  Negative for chest pain and palpitations.  Gastrointestinal: Negative.  Negative for constipation, diarrhea and vomiting.  Musculoskeletal:  Negative for arthralgias and myalgias.  Neurological:  Positive for headaches.    Today's Vitals   07/10/21 1427  BP: 132/88  Pulse: 72  Temp: 98.4 F (36.9 C)  TempSrc: Oral  Weight: 207 lb 12.8 oz (94.3 kg)  Height: 5\' 4"  (1.626 m)   Body mass index is 35.67 kg/m.  Wt Readings from Last 3 Encounters:  07/10/21 207 lb 12.8 oz (94.3 kg)  02/12/21 200 lb 6.4 oz (90.9 kg)  12/26/20 201 lb 9.6 oz (91.4 kg)    Objective:  Physical Exam Constitutional:      Appearance: Normal appearance.  HENT:     Head: Normocephalic and atraumatic.  Cardiovascular:     Rate and Rhythm: Normal rate and regular rhythm.     Pulses: Normal pulses.     Heart  sounds: Normal heart sounds. No murmur heard. Pulmonary:     Effort: Pulmonary effort is normal. No respiratory distress.     Breath sounds: Normal breath sounds. No wheezing.  Skin:    General: Skin is warm and dry.     Capillary Refill: Capillary refill takes less than 2 seconds.  Neurological:     Mental Status: She is alert.        Assessment And Plan:     1. Essential hypertension -Chronic, stable. -Continue HCTZ 25 mg daily.  -Today BP in office was 132/88; patient is not interested in adding medication to her regimen at this time. -Advised patient to reduce her stress, work on her diet by decreasing salt  intake and increase exercise  -Patient will come for Nurse visit next week with her BP cuff for a BP check.  -Limit the intake of processed foods and salt intake. You should increase your intake of green vegetables and fruits. Limit the use of alcohol. Limit fast foods and fried foods. Avoid high fatty saturated and trans fat foods. Keep yourself hydrated with drinking water. Avoid red meats. Eat lean meats instead. Exercise for atleast 30-45 min for atleast 4-5 times a week.    2. Class 2 severe obesity due to excess calories with serious comorbidity and body mass index (BMI) of 35.0 to 35.9 in adult [facility]) Advised patient on a healthy diet including avoiding fast food and red meats. Increase the intake of lean meats including grilled chicken and [place].  Drink a lot of water. Decrease intake of fatty foods. Exercise for 30-45 min. 4-5 a week to decrease the risk of cardiac event.   Follow up: 1 week for nurse visit for BP check   The patient was encouraged to call or send a message through MyChart for any questions or concerns.   Side effects and appropriate use of all the medication(s) were discussed with the patient today. Patient advised to use the medication(s) as directed by their healthcare provider. The patient was encouraged to read, review, and understand all associated package inserts and contact our office with any questions or concerns. The patient accepts the risks of the treatment plan and had an opportunity to ask questions.   Staying healthy and adopting a healthy lifestyle for your overall health is important. You should eat 7 or more servings of fruits and vegetables per day. You should drink plenty of water to keep yourself hydrated and your kidneys healthy. This includes about 65-80+ fluid ounces of water. Limit your intake of animal fats especially for elevated cholesterol. Avoid highly processed food and limit your salt intake if you have hypertension. Avoid foods high in  saturated/Trans fats. Along with a healthy diet it is also very important to maintain time for yourself to maintain a healthy mental health with low stress levels. You should get atleast 150 min of moderate intensity exercise weekly for a healthy heart. Along with eating right and exercising, aim for at least 7-9 hours of sleep daily.  Eat more whole grains which includes barley, wheat berries, oats, brown rice and whole wheat pasta. Use healthy plant oils which include olive, soy, corn, sunflower and peanut. Limit your caffeine and sugary drinks. Limit your intake of fast foods. Limit milk and dairy products to one or two daily servings.   Patient was given opportunity to ask questions. Patient verbalized understanding of the plan and was able to repeat key elements of the plan. All questions were answered to their  satisfaction.  Olivia Amaree Leeper, DNP   I, [name] have reviewed all documentation for this visit. The documentation on 07/10/21 for the exam, diagnosis, procedures, and orders are all accurate and complete.   IF YOU HAVE BEEN REFERRED TO A SPECIALIST, IT MAY TAKE 1-2 WEEKS TO SCHEDULE/PROCESS THE REFERRAL. IF YOU HAVE NOT HEARD FROM US/SPECIALIST IN TWO WEEKS, PLEASE GIVE [place] A CALL AT [phone].   THE PATIENT IS ENCOURAGED TO PRACTICE SOCIAL DISTANCING DUE TO THE COVID-19 PANDEMIC.

## 2021-07-10 NOTE — Patient Instructions (Signed)

## 2021-07-17 ENCOUNTER — Ambulatory Visit: Payer: BLUE CROSS/BLUE SHIELD

## 2021-07-18 ENCOUNTER — Other Ambulatory Visit: Payer: Self-pay

## 2021-07-18 ENCOUNTER — Encounter: Payer: Self-pay | Admitting: Nurse Practitioner

## 2021-07-18 ENCOUNTER — Encounter: Payer: BLUE CROSS/BLUE SHIELD | Admitting: Nurse Practitioner

## 2021-07-18 NOTE — Progress Notes (Signed)
Patient cancelled appt.

## 2021-08-27 DIAGNOSIS — R197 Diarrhea, unspecified: Secondary | ICD-10-CM | POA: Diagnosis not present

## 2021-08-27 DIAGNOSIS — R110 Nausea: Secondary | ICD-10-CM | POA: Diagnosis not present

## 2021-08-27 DIAGNOSIS — R1013 Epigastric pain: Secondary | ICD-10-CM | POA: Diagnosis not present

## 2021-08-28 DIAGNOSIS — R1013 Epigastric pain: Secondary | ICD-10-CM | POA: Diagnosis not present

## 2021-08-28 DIAGNOSIS — R110 Nausea: Secondary | ICD-10-CM | POA: Diagnosis not present

## 2021-09-20 ENCOUNTER — Encounter: Payer: Self-pay | Admitting: Nurse Practitioner

## 2021-09-21 ENCOUNTER — Ambulatory Visit
Admission: EM | Admit: 2021-09-21 | Discharge: 2021-09-21 | Disposition: A | Discharge status: Home / Self Care | Payer: BLUE CROSS/BLUE SHIELD | Attending: Emergency Medicine | Admitting: Emergency Medicine

## 2021-09-21 ENCOUNTER — Ambulatory Visit
Admission: RE | Admit: 2021-09-21 | Discharge: 2021-09-21 | Disposition: A | Discharge status: Home / Self Care | Payer: BLUE CROSS/BLUE SHIELD | Source: Ambulatory Visit

## 2021-09-21 ENCOUNTER — Other Ambulatory Visit: Payer: Self-pay

## 2021-09-21 DIAGNOSIS — I10 Essential (primary) hypertension: Secondary | ICD-10-CM

## 2021-09-21 MED ORDER — AMLODIPINE BESYLATE 2.5 MG PO TABS: 2.5000 mg | ORAL_TABLET | Freq: Every day | ORAL | 0 refills | Status: DC

## 2021-09-21 NOTE — ED Provider Notes (Signed)
[facility]    CSN: [id number] Arrival date & time: 09/21/21  1130   History   Chief Complaint No chief complaint on file.  HPI [name] is a 40 y.o. female. Patient complains of a headache for the past several days, states she is also noticed that her blood pressures been elevated patient states that she reached out her primary care provider who told her to come to urgent care to be evaluated.  Patient states she been checking her blood pressure when she is having a headache and is always similar to what it is now, 147/91.  Patient states she does not routinely check her blood pressure first in the morning.  Patient states currently taking hydrochlorothiazide 25 mg every day in the morning.  Patient states initially this helped but feels that it is no longer working for her.  Patient denies headache, diaphoresis, shortness of breath, proximal nocturnal dyspnea.  The history is provided by the patient.   Past Medical History:  Diagnosis Date   Gestational hypertension    Patient Active Problem List   Diagnosis Date Noted   Essential hypertension 09/29/2019   Past Surgical History:  Procedure Laterality Date   CESAREAN SECTION     INGUINAL HERNIA REPAIR     OB History     Gravida  4   Para  1   Term  1   Preterm      AB  2   Living  1      SAB  1   IAB  1   Ectopic      Multiple      Live Births             Home Medications    Prior to Admission medications   Medication Sig Start Date End Date Taking? Authorizing Provider  amLODipine (NORVASC) 2.5 MG tablet Take 1 tablet (2.5 mg total) by mouth daily. 09/21/21 10/21/21 Yes Lynden Oxford Scales, PA-C  cetirizine (ZYRTEC) 10 MG tablet Take 1 tablet (10 mg total) by mouth daily. 02/22/20   Minette Brine, FNP  hydrochlorothiazide (HYDRODIURIL) 25 MG tablet TAKE 1 TABLET BY MOUTH EVERY DAY 04/16/21   Minette Brine, FNP  omeprazole (PRILOSEC) 40 MG capsule TAKE 1 CAPSULE BY MOUTH EVERY DAY  04/02/21   Minette Brine, FNP  Vitamin D, Ergocalciferol, (DRISDOL) 1.25 MG (50000 UNIT) CAPS capsule Take 1 capsule (50,000 Units total) by mouth 2 (two) times a week. 12/28/20   Minette Brine, FNP   Family History Family History  Problem Relation Age of Onset   Hypertension Mother    Hypertension Father    Other Neg Hx    Social History Social History   Tobacco Use   Smoking status: Never   Smokeless tobacco: Never  Substance Use Topics   Alcohol use: No   Drug use: No   Allergies   Diflucan [fluconazole]  Review of Systems Review of Systems Pertinent findings noted in history of present illness.   Physical Exam Triage Vital Signs ED Triage Vitals  Enc Vitals Group     BP 09/07/21 0827 (!) 147/82     Pulse Rate 09/07/21 0827 72     Resp 09/07/21 0827 18     Temp 09/07/21 0827 98.3 F (36.8 C)     Temp Source 09/07/21 0827 Oral     SpO2 09/07/21 0827 98 %     Weight --      Height --      Head  Circumference --      Peak Flow --      Pain Score 09/07/21 0826 5     Pain Loc --      Pain Edu? --      Excl. in [place]? --    No data found.  Updated Vital Signs BP (!) 147/91 (BP Location: Left Arm)   Pulse (!) 58   Temp 98.9 F (37.2 C) (Oral)   Resp 18   SpO2 97%   Visual Acuity Right Eye Distance:   Left Eye Distance:   Bilateral Distance:    Right Eye Near:   Left Eye Near:    Bilateral Near:     Physical Exam Vitals and nursing note reviewed.  Constitutional:      General: She is not in acute distress.    Appearance: Normal appearance. She is not ill-appearing.  HENT:     Head: Normocephalic and atraumatic.  Eyes:     General: Lids are normal.        Right eye: No discharge.        Left eye: No discharge.     Extraocular Movements: Extraocular movements intact.     Conjunctiva/sclera: Conjunctivae normal.     Right eye: Right conjunctiva is not injected.     Left eye: Left conjunctiva is not injected.  Neck:     Trachea: Trachea and phonation  normal.  Cardiovascular:     Rate and Rhythm: Normal rate and regular rhythm.     Pulses: Normal pulses.     Heart sounds: Normal heart sounds. No murmur heard.   No friction rub. No gallop.  Pulmonary:     Effort: Pulmonary effort is normal. No accessory muscle usage, prolonged expiration or respiratory distress.     Breath sounds: Normal breath sounds. No stridor, decreased air movement or transmitted upper airway sounds. No decreased breath sounds, wheezing, rhonchi or rales.  Chest:     Chest wall: No tenderness.  Musculoskeletal:        General: Normal range of motion.     Cervical back: Normal range of motion and neck supple. Normal range of motion.  Lymphadenopathy:     Cervical: No cervical adenopathy.  Skin:    General: Skin is warm and dry.     Findings: No erythema or rash.  Neurological:     General: No focal deficit present.     Mental Status: She is alert and oriented to person, place, and time.  Psychiatric:        Mood and Affect: Mood normal.        Behavior: Behavior normal.   UC Treatments / Results  Labs (all labs ordered are listed, but only abnormal results are displayed)  Labs Reviewed - No data to display  EKG  Radiology No results found.  Procedures Procedures (including critical care time)  Medications Ordered in UC Medications - No data to display  Initial Impression / Assessment and Plan / UC Course  I have reviewed the triage vital signs and the nursing notes.  Pertinent labs & imaging results that were available during my care of the patient were reviewed by me and considered in my medical decision making (see chart for details).      Physical exam today is unremarkable, patient has normal heart sounds normal breath sounds, patient does not have any lower extremity edema.  Patient advised to continue hydrochlorothiazide.  Patient advised to begin an additional medication, amlodipine 2.5, to be taken daily  with hydrochlorothiazide and that  she should follow-up with her primary care physician in 2 weeks for repeat blood pressure check and discussion of this medication.  Final Clinical Impressions(s) / UC Diagnoses   Final diagnoses:  Essential hypertension     Discharge Instructions      Please add amlodipine 2.5 mg to your morning regimen of hydrochlorothiazide 25 mg.  Please make an appointment to follow-up with your primary care provider in the next 2 weeks for blood pressure check a conversation about her blood pressure.  Thank you for visiting urgent care today, I know your time is valuable and I appreciate your waiting.     ED Prescriptions     Medication Sig Dispense Auth. Provider   amLODipine (NORVASC) 2.5 MG tablet Take 1 tablet (2.5 mg total) by mouth daily. 30 tablet Theadora Rama Scales, PA-C      PDMP not reviewed this encounter.  Disposition Upon Discharge:  Patient presented with an acute illness with associated systemic symptoms and significant discomfort requiring urgent management. In my opinion, this is a condition that a prudent lay person (someone who possesses an average knowledge of health and medicine) may potentially expect to result in complications if not addressed urgently such as respiratory distress, impairment of bodily function or dysfunction of bodily organs.   Routine symptom specific, illness specific and/or disease specific instructions were discussed with the patient and/or caregiver at length.   As such, the patient has been evaluated and assessed, work-up was performed and treatment was provided in alignment with urgent care protocols and evidence based medicine.  Patient/parent/caregiver has been advised that the patient may require follow up for further testing and treatment if the symptoms continue in spite of treatment, as clinically indicated and appropriate.  Patient/parent/caregiver has been advised to return to the [facility] or PCP in 3-5 days if no better; to PCP or the  Emergency Department if new signs and symptoms develop, or if the current signs or symptoms continue to change or worsen for further workup, evaluation and treatment as clinically indicated and appropriate  The patient will follow up with their current PCP if and as advised. If the patient does not currently have a PCP we will assist them in obtaining one.   Patient/parent/caregiver verbalized understanding and agreement of plan as discussed.  All questions were addressed during visit.  Please see discharge instructions below for further details of plan.  Condition: stable for discharge home Home: take medications as prescribed; routine discharge instructions as discussed; follow up as advised.    Theadora Rama Scales, PA-C 09/21/21 1512

## 2021-09-21 NOTE — ED Triage Notes (Signed)
Pt reports having a headache for a few days (since Tuesday), pt states her BP has been elevated and was directed by PCP to be seen here.   Patient denies having any vision changes and speech is clear.

## 2021-09-21 NOTE — Discharge Instructions (Signed)
Please add amlodipine 2.5 mg to your morning regimen of hydrochlorothiazide 25 mg.  Please make an appointment to follow-up with your primary care provider in the next 2 weeks for blood pressure check a conversation about her blood pressure.  Thank you for visiting urgent care today, I know your time is valuable and I appreciate your waiting.

## 2021-09-30 IMAGING — MG DIGITAL DIAGNOSTIC BILAT W/ TOMO W/ CAD
8 series · 8 of 24 positions shown · non-contrast
Comparison: None.

CLINICAL DATA: The patient's physician palpated an abnormality in
the 10 o'clock region of the right breast. Patient states that she
does not feel a mass in the 10 o'clock region of the breast.

EXAM:
DIGITAL DIAGNOSTIC BILATERAL MAMMOGRAM WITH TOMOSYNTHESIS AND CAD;
ULTRASOUND RIGHT BREAST LIMITED
TECHNIQUE: Bilateral digital diagnostic mammography and breast tomosynthesis
was performed. The images were evaluated with computer-aided
detection.; Targeted ultrasound examination of the right breast was
performed

[L MLO synth-2D]
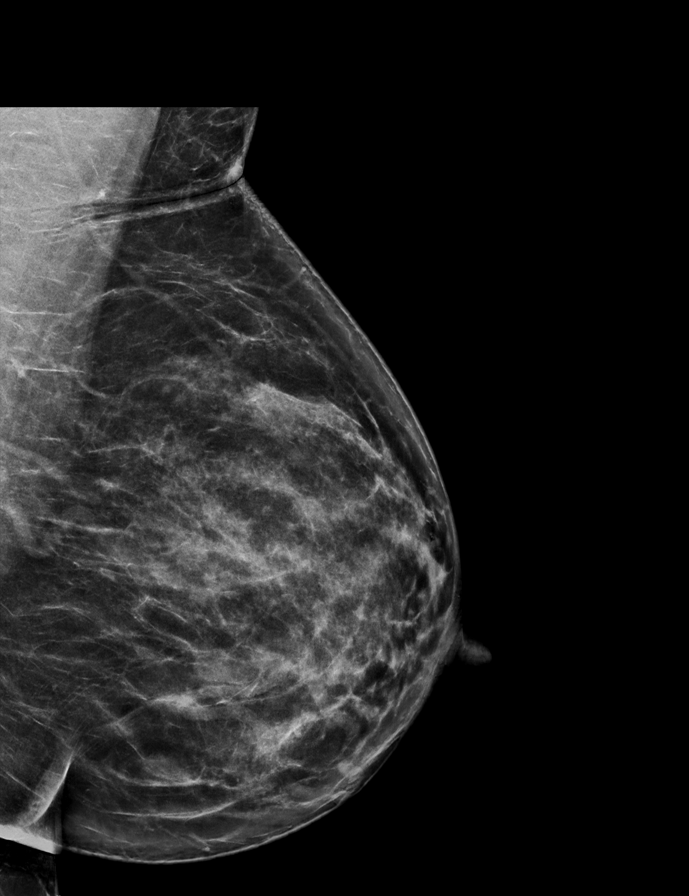

[R MLO synth-2D]
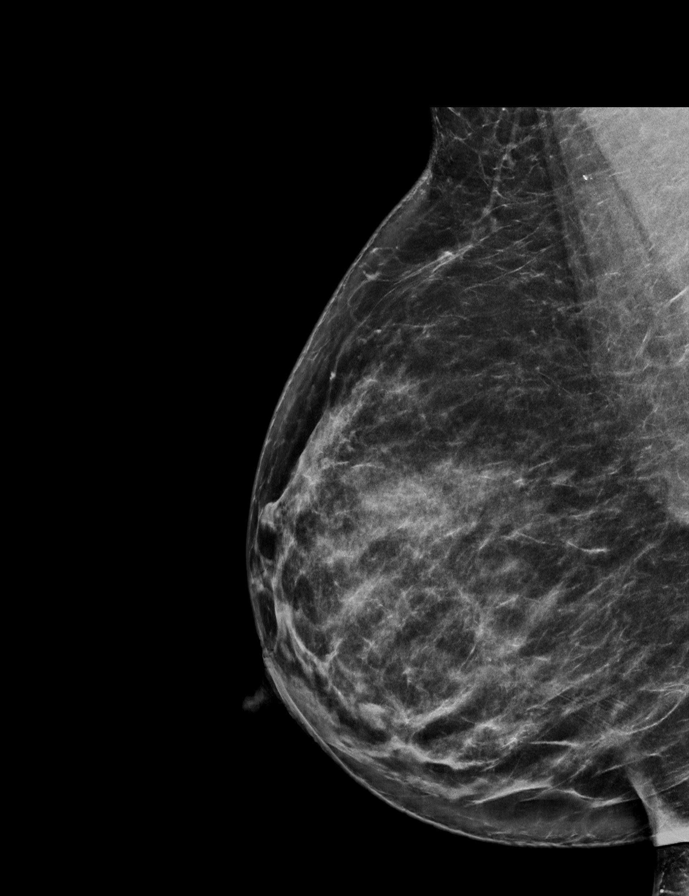

[R CC synth-2D]
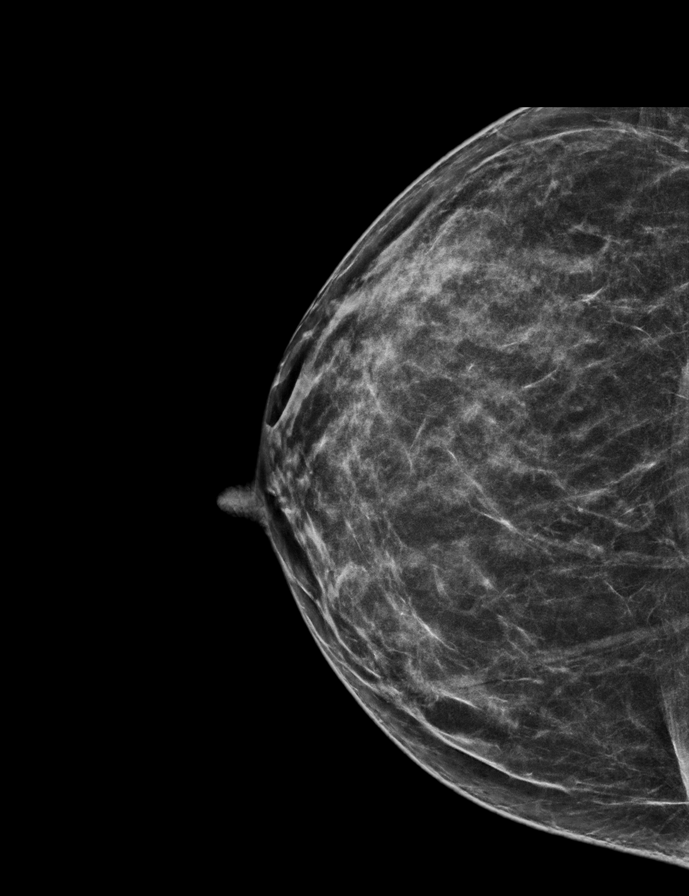

[L CC synth-2D]
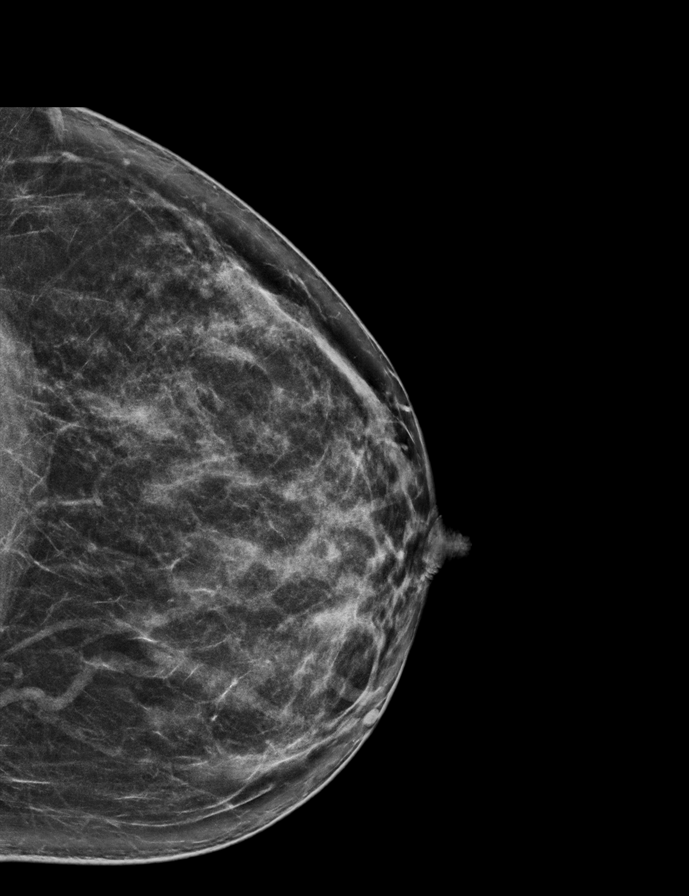

[L CC tomo · tomo slice 33/66.0]
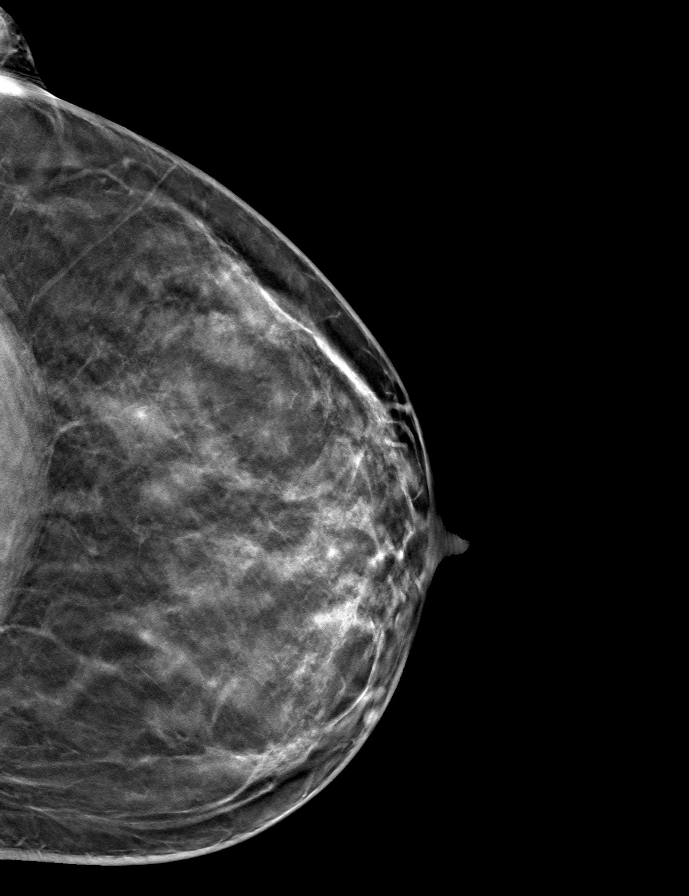

[R MLO tomo · tomo slice 37/74.0]
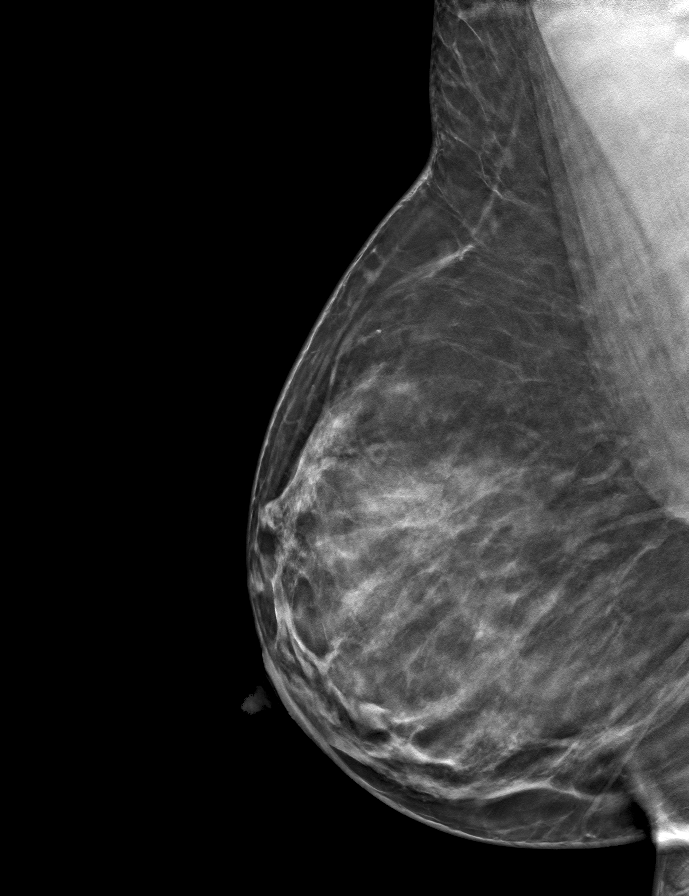

[R CC tomo · tomo slice 32/63.0]
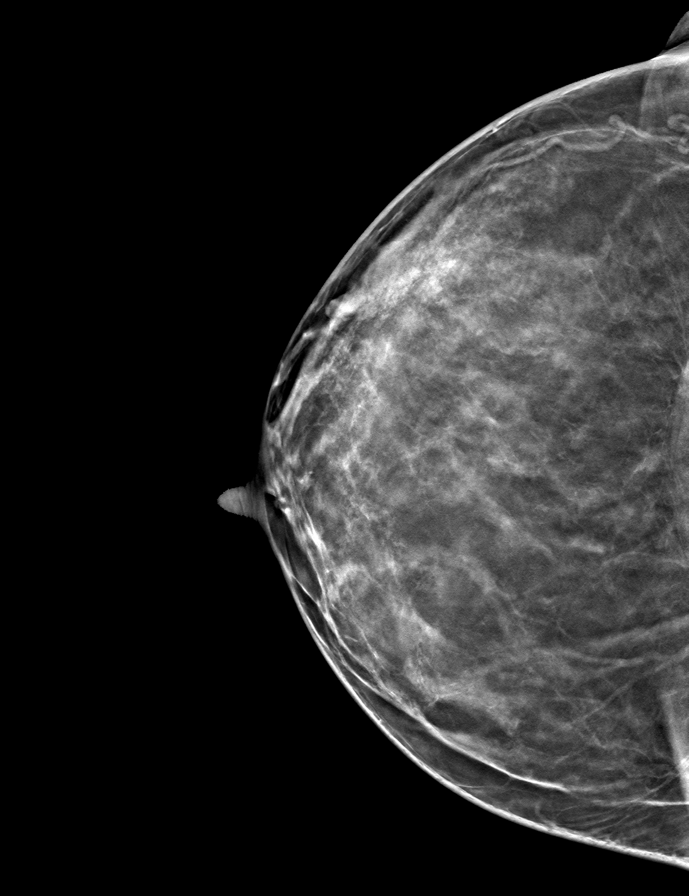

[L MLO tomo · tomo slice 38/75.0]
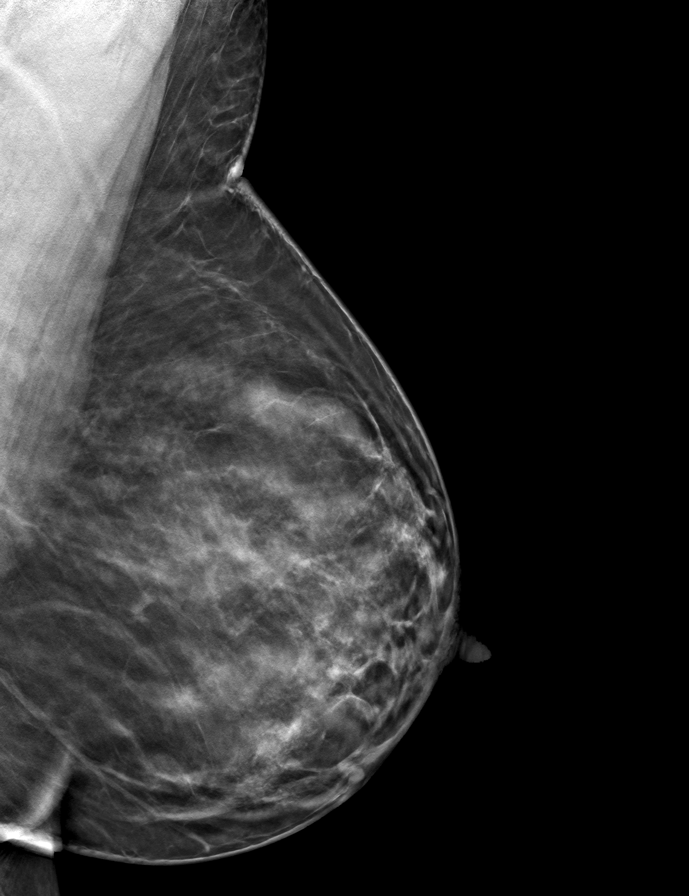

[8 of 24 positions shown; findings below may reference images not displayed]

ACR Breast Density Category c: The breast tissue is heterogeneously
dense, which may obscure small masses.
FINDINGS: No suspicious mass, malignant type microcalcifications or distortion
detected in either breast.

On physical exam, I do not palpate a mass in the 10 o'clock region
of the right breast.

Targeted ultrasound is performed, showing normal tissue in the 10
o'clock region of the right breast. No solid or cystic mass,
abnormal shadowing or distortion visualized.
IMPRESSION: No evidence of malignancy in either breast.

RECOMMENDATION:
If the clinical exam remains benign/stable screening mammography can
be deferred until the age of 40.

I have discussed the findings and recommendations with the patient.
If applicable, a reminder letter will be sent to the patient
regarding the next appointment.

BI-RADS CATEGORY  1: Negative.

## 2021-10-02 ENCOUNTER — Encounter: Payer: Self-pay | Admitting: Nurse Practitioner

## 2021-10-02 ENCOUNTER — Other Ambulatory Visit: Payer: Self-pay

## 2021-10-02 MED ORDER — OMEPRAZOLE 40 MG PO CPDR: DELAYED_RELEASE_CAPSULE | ORAL | 1 refills | Status: DC

## 2021-10-03 ENCOUNTER — Encounter: Payer: Self-pay | Admitting: Nurse Practitioner

## 2021-10-15 ENCOUNTER — Other Ambulatory Visit: Payer: Self-pay | Admitting: Nurse Practitioner

## 2021-10-15 DIAGNOSIS — I10 Essential (primary) hypertension: Secondary | ICD-10-CM

## 2021-10-17 ENCOUNTER — Encounter: Payer: Self-pay | Admitting: Nurse Practitioner

## 2021-10-17 DIAGNOSIS — Z113 Encounter for screening for infections with a predominantly sexual mode of transmission: Secondary | ICD-10-CM | POA: Diagnosis not present

## 2021-10-17 DIAGNOSIS — Z130 Encounter for screening for diseases of the blood and blood-forming organs and certain disorders involving the immune mechanism: Secondary | ICD-10-CM | POA: Diagnosis not present

## 2021-10-17 DIAGNOSIS — Z01419 Encounter for gynecological examination (general) (routine) without abnormal findings: Secondary | ICD-10-CM | POA: Diagnosis not present

## 2021-10-17 DIAGNOSIS — L293 Anogenital pruritus, unspecified: Secondary | ICD-10-CM | POA: Diagnosis not present

## 2021-10-17 DIAGNOSIS — Z124 Encounter for screening for malignant neoplasm of cervix: Secondary | ICD-10-CM | POA: Diagnosis not present

## 2021-10-17 DIAGNOSIS — E669 Obesity, unspecified: Secondary | ICD-10-CM | POA: Diagnosis not present

## 2021-10-18 ENCOUNTER — Other Ambulatory Visit: Payer: Self-pay

## 2021-10-18 ENCOUNTER — Encounter: Payer: Self-pay | Admitting: Nurse Practitioner

## 2021-10-18 MED ORDER — AMLODIPINE BESYLATE 2.5 MG PO TABS: 2.5000 mg | ORAL_TABLET | Freq: Every day | ORAL | 0 refills | Status: DC

## 2021-11-07 ENCOUNTER — Ambulatory Visit (INDEPENDENT_AMBULATORY_CARE_PROVIDER_SITE_OTHER): Payer: BLUE CROSS/BLUE SHIELD | Admitting: Nurse Practitioner

## 2021-11-07 ENCOUNTER — Encounter: Payer: Self-pay | Admitting: Nurse Practitioner

## 2021-11-07 ENCOUNTER — Other Ambulatory Visit: Payer: Self-pay

## 2021-11-07 VITALS — BP 134/86 | HR 70 | Temp 98.4°F | Ht 64.0 in | Wt 211.8 lb

## 2021-11-07 DIAGNOSIS — Z114 Encounter for screening for human immunodeficiency virus [HIV]: Secondary | ICD-10-CM

## 2021-11-07 DIAGNOSIS — E559 Vitamin D deficiency, unspecified: Secondary | ICD-10-CM

## 2021-11-07 DIAGNOSIS — R41840 Attention and concentration deficit: Secondary | ICD-10-CM | POA: Diagnosis not present

## 2021-11-07 DIAGNOSIS — I10 Essential (primary) hypertension: Secondary | ICD-10-CM

## 2021-11-07 DIAGNOSIS — E6609 Other obesity due to excess calories: Secondary | ICD-10-CM | POA: Diagnosis not present

## 2021-11-07 DIAGNOSIS — Z23 Encounter for immunization: Secondary | ICD-10-CM | POA: Diagnosis not present

## 2021-11-07 DIAGNOSIS — Z6836 Body mass index (BMI) 36.0-36.9, adult: Secondary | ICD-10-CM

## 2021-11-07 MED ORDER — MAGNESIUM 250 MG PO TABS: 1.0000 | ORAL_TABLET | Freq: Every day | ORAL | 2 refills | Status: DC

## 2021-11-07 NOTE — Patient Instructions (Signed)

## 2021-11-07 NOTE — Progress Notes (Signed)
I,Yamilka J Llittleton,acting as a Education administrator for [employer], FNP.,have documented all relevant documentation on the behalf of Minette Brine, FNP,as directed by  Minette Brine, FNP while in the presence of Minette Brine, [place].  This visit occurred during the SARS-CoV-2 public health emergency.  Safety protocols were in place, including screening questions prior to the visit, additional usage of staff PPE, and extensive cleaning of exam room while observing appropriate contact time as indicated for disinfecting solutions.  Subjective:     Patient ID: [name] , female    DOB: 28-May-1981 , 40 y.o.   MRN: [id number]   Chief Complaint  Patient presents with   Hypertension   ADHD    HPI  Patient stated she went to urgent care and started on amlodipine 2.55m. she stated her bp has been reading a little better since started the med. She does not have a headache any more. Blood pressure 150/100, 140/99 prior to her visit to the ER. She continues to take HCTZ.  She does feel the readings are better since going to urgent care.  She also stated she would like to talk to you about starting ADHD meds she was referred to 3 different places and none of them took her insurance.   [facility] not accepting new patients. [facility] did not accept [employer].    Wt Readings from Last 3 Encounters: 11/07/21 : 211 lb 12.8 oz (96.1 kg) 07/18/21 : 205 lb (93 kg) 07/10/21 : 207 lb 12.8 oz (94.3 kg)    Hypertension This is a new problem. The current episode started 1 to 4 weeks ago. Pertinent negatives include no anxiety, chest pain, headaches or palpitations. Agents associated with hypertension include NSAIDs. Risk factors for coronary artery disease include sedentary lifestyle and obesity. Past treatments include diuretics and calcium channel blockers. Compliance problems include exercise (last 1.5 months has not been going to exercise).  There is no history of angina.    Past Medical  History:  Diagnosis Date   Gestational hypertension      Family History  Problem Relation Age of Onset   Hypertension Mother    Hypertension Father    Other Neg Hx      Current Outpatient Medications:    hydrochlorothiazide (HYDRODIURIL) 25 MG tablet, TAKE 1 TABLET BY MOUTH EVERY DAY, Disp: 90 tablet, Rfl: 1   Magnesium 250 MG TABS, Take 1 tablet (250 mg total) by mouth daily., Disp: 30 tablet, Rfl: 2   omeprazole (PRILOSEC) 40 MG capsule, TAKE 1 CAPSULE BY MOUTH EVERY DAY, Disp: 90 capsule, Rfl: 1   Vitamin D, Ergocalciferol, (DRISDOL) 1.25 MG (50000 UNIT) CAPS capsule, Take 1 capsule (50,000 Units total) by mouth 2 (two) times a week., Disp: 24 capsule, Rfl: 1   amLODipine (NORVASC) 2.5 MG tablet, TAKE 1 TABLET BY MOUTH EVERY DAY, Disp: 90 tablet, Rfl: 0   cetirizine (ZYRTEC) 10 MG tablet, Take 1 tablet (10 mg total) by mouth daily. (Patient not taking: Reported on 11/07/2021), Disp: 90 tablet, Rfl: 1   Allergies  Allergen Reactions   Diflucan [Fluconazole] Other (See Comments)    Skin on her hand burned off.     Review of Systems  Constitutional: Negative.  Negative for fatigue.  HENT: Negative.    Eyes: Negative.   Respiratory: Negative.    Cardiovascular: Negative.  Negative for chest pain, palpitations and leg swelling.  Gastrointestinal: Negative.   Endocrine: Negative.   Genitourinary: Negative.   Musculoskeletal: Negative.   Skin:  Negative.   Allergic/Immunologic: Negative.   Neurological: Negative.  Negative for headaches.  Hematological: Negative.   Psychiatric/Behavioral: Negative.         Concentration problems    Today's Vitals   11/07/21 1646  BP: 134/86  Pulse: 70  Temp: 98.4 F (36.9 C)  Weight: 211 lb 12.8 oz (96.1 kg)  Height: 5' 4"  (1.626 m)  PainSc: 0-No pain   Body mass index is 36.36 kg/m.   Objective:  Physical Exam Vitals reviewed.  Constitutional:      General: She is not in acute distress.    Appearance: Normal appearance.   Cardiovascular:     Rate and Rhythm: Normal rate and regular rhythm.     Pulses: Normal pulses.     Heart sounds: Normal heart sounds. No murmur heard. Pulmonary:     Effort: Pulmonary effort is normal. No respiratory distress.     Breath sounds: Normal breath sounds. No wheezing.  Neurological:     General: No focal deficit present.     Mental Status: She is alert and oriented to person, place, and time.     Cranial Nerves: No cranial nerve deficit.  Psychiatric:        Mood and Affect: Mood normal.        Behavior: Behavior normal.        Thought Content: Thought content normal.        Judgment: Judgment normal.        Assessment And Plan:     1. Essential hypertension Comments: Blood pressure is fairly controlled, continue current medications and make sure to avoid high salt foods.  - BMP8+eGFR - Magnesium 250 MG TABS; Take 1 tablet (250 mg total) by mouth daily.  Dispense: 30 tablet; Refill: 2  2. Vitamin D deficiency Will check vitamin D level and supplement as needed.    Also encouraged to spend 15 minutes in the sun daily.  - VITAMIN D 25 Hydroxy (Vit-D Deficiency, Fractures)  3. Lack of concentration Comments: She continues to have problems finding a provider to take her insurance to check her for concentration issues  4. Class 2 obesity due to excess calories with body mass index (BMI) of 36.0 to 36.9 in adult, unspecified whether serious comorbidity present Chronic Discussed healthy diet and regular exercise options  Encouraged to exercise at least 150 minutes per week with 2 days of strength training  5. Immunization due Influenza vaccine administered Encouraged to take Tylenol as needed for fever or muscle aches. - Flu Vaccine QUAD 6+ mos PF IM (Fluarix Quad PF)  6. Encounter for screening for human immunodeficiency virus (HIV)     Patient was given opportunity to ask questions. Patient verbalized understanding of the plan and was able to repeat key  elements of the plan. All questions were answered to their satisfaction.  Minette Brine, FNP   I, Minette Brine, FNP, have reviewed all documentation for this visit. The documentation on 11/07/2021 for the exam, diagnosis, procedures, and orders are all accurate and complete.   IF YOU HAVE BEEN REFERRED TO A SPECIALIST, IT MAY TAKE 1-2 WEEKS TO SCHEDULE/PROCESS THE REFERRAL. IF YOU HAVE NOT HEARD FROM US/SPECIALIST IN TWO WEEKS, PLEASE GIVE [place] A CALL AT [phone].   THE PATIENT IS ENCOURAGED TO PRACTICE SOCIAL DISTANCING DUE TO THE COVID-19 PANDEMIC.

## 2021-11-08 LAB — BMP8+EGFR
BUN/Creatinine Ratio: 16 (ref 9–23)
BUN: 17 mg/dL (ref 6–20)
CO2: 24 mmol/L (ref 20–29)
Calcium: 9.9 mg/dL (ref 8.7–10.2)
Chloride: 100 mmol/L (ref 96–106)
Creatinine, Ser: 1.04 mg/dL — ABNORMAL HIGH (ref 0.57–1.00)
Glucose: 89 mg/dL (ref 70–99)
Potassium: 3.9 mmol/L (ref 3.5–5.2)
Sodium: 140 mmol/L (ref 134–144)
eGFR: 70 mL/min/{1.73_m2} (ref >59)

## 2021-11-08 LAB — VITAMIN D 25 HYDROXY (VIT D DEFICIENCY, FRACTURES): Vit D, 25-Hydroxy: 46 ng/mL (ref 30.0–100.0)

## 2021-11-14 ENCOUNTER — Other Ambulatory Visit: Payer: Self-pay | Admitting: Nurse Practitioner

## 2021-12-11 ENCOUNTER — Encounter: Payer: Self-pay | Admitting: Nurse Practitioner

## 2021-12-12 ENCOUNTER — Ambulatory Visit: Payer: BLUE CROSS/BLUE SHIELD | Admitting: Nurse Practitioner

## 2021-12-27 ENCOUNTER — Ambulatory Visit (INDEPENDENT_AMBULATORY_CARE_PROVIDER_SITE_OTHER): Payer: 59 | Admitting: Nurse Practitioner

## 2021-12-27 ENCOUNTER — Other Ambulatory Visit: Payer: Self-pay

## 2021-12-27 ENCOUNTER — Encounter: Payer: Self-pay | Admitting: Nurse Practitioner

## 2021-12-27 VITALS — BP 124/82 | HR 60 | Temp 98.1°F | Ht 64.0 in | Wt 208.8 lb

## 2021-12-27 DIAGNOSIS — E559 Vitamin D deficiency, unspecified: Secondary | ICD-10-CM

## 2021-12-27 DIAGNOSIS — R41840 Attention and concentration deficit: Secondary | ICD-10-CM

## 2021-12-27 DIAGNOSIS — Z79899 Other long term (current) drug therapy: Secondary | ICD-10-CM

## 2021-12-27 DIAGNOSIS — G44219 Episodic tension-type headache, not intractable: Secondary | ICD-10-CM

## 2021-12-27 DIAGNOSIS — Z0000 Encounter for general adult medical examination without abnormal findings: Secondary | ICD-10-CM

## 2021-12-27 DIAGNOSIS — I10 Essential (primary) hypertension: Secondary | ICD-10-CM | POA: Diagnosis not present

## 2021-12-27 DIAGNOSIS — Z6835 Body mass index (BMI) 35.0-35.9, adult: Secondary | ICD-10-CM

## 2021-12-27 LAB — POCT URINALYSIS DIPSTICK
Bilirubin, UA: NEGATIVE
Glucose, UA: NEGATIVE
Ketones, UA: NEGATIVE
Leukocytes, UA: NEGATIVE
Nitrite, UA: NEGATIVE
Protein, UA: NEGATIVE
Spec Grav, UA: 1.02 (ref 1.010–1.025)
Urobilinogen, UA: 0.2 E.U./dL
pH, UA: 6 (ref 5.0–8.0)

## 2021-12-27 MED ORDER — SUMATRIPTAN SUCCINATE 50 MG PO TABS
50.0000 mg | ORAL_TABLET | Freq: Once | ORAL | 2 refills | Status: DC | PRN
Start: 2021-12-27 — End: 2022-11-21

## 2021-12-27 NOTE — Patient Instructions (Signed)

## 2021-12-27 NOTE — Progress Notes (Signed)
I,Katawbba Wiggins,acting as a Education administrator for [employer], FNP.,have documented all relevant documentation on the behalf of Olivia Brine, FNP,as directed by  Olivia Brine, FNP while in the presence of [name], [place].   This visit occurred during the SARS-CoV-2 public health emergency.  Safety protocols were in place, including screening questions prior to the visit, additional usage of staff PPE, and extensive cleaning of exam room while observing appropriate contact time as indicated for disinfecting solutions.  Subjective:     Patient ID: [name] , female    DOB: [birth year] , 41 y.o.   MRN: [id number]   Chief Complaint  Patient presents with   Annual Exam    HPI  Here for hm. The patient is followed by Charlesetta Ivory, last pap was December 2022.  Wt Readings from Last 3 Encounters: 12/27/21 : 208 lb 12.8 oz (94.7 kg) 11/07/21 : 211 lb 12.8 oz (96.1 kg) 07/18/21 : 205 lb (93 kg)       Past Medical History:  Diagnosis Date   Gestational hypertension      Family History  Problem Relation Age of Onset   Hypertension Mother    Hypertension Father    Other Neg Hx      Current Outpatient Medications:    amLODipine (NORVASC) 2.5 MG tablet, TAKE 1 TABLET BY MOUTH EVERY DAY, Disp: 90 tablet, Rfl: 0   cetirizine (ZYRTEC) 10 MG tablet, Take 1 tablet (10 mg total) by mouth daily., Disp: 90 tablet, Rfl: 1   hydrochlorothiazide (HYDRODIURIL) 25 MG tablet, TAKE 1 TABLET BY MOUTH EVERY DAY, Disp: 90 tablet, Rfl: 1   omeprazole (PRILOSEC) 40 MG capsule, TAKE 1 CAPSULE BY MOUTH EVERY DAY, Disp: 90 capsule, Rfl: 1   SUMAtriptan (IMITREX) 50 MG tablet, Take 1 tablet (50 mg total) by mouth once as needed for migraine. May repeat in 2 hours if headache persists or recurs., Disp: 30 tablet, Rfl: 2   Vitamin D, Ergocalciferol, (DRISDOL) 1.25 MG (50000 UNIT) CAPS capsule, Take 1 capsule (50,000 Units total) by mouth 2 (two) times a week., Disp: 24 capsule, Rfl: 1   Magnesium 250 MG  TABS, Take 1 tablet (250 mg total) by mouth daily. (Patient not taking: Reported on 12/27/2021), Disp: 30 tablet, Rfl: 2   Allergies  Allergen Reactions   Diflucan [Fluconazole] Other (See Comments)    Skin on her hand burned off.      The patient states she uses IUD for birth control.  No LMP recorded. (Menstrual status: IUD).  Negative for Dysmenorrhea and Negative for Menorrhagia. Negative for: breast discharge, breast lump(s), breast pain and breast self exam. Associated symptoms include abnormal vaginal bleeding. Pertinent negatives include abnormal bleeding (hematology), anxiety, decreased libido, depression, difficulty falling sleep, dyspareunia, history of infertility, nocturia, sexual dysfunction, sleep disturbances, urinary incontinence, urinary urgency, vaginal discharge and vaginal itching. Diet healthy, mostly vegetables, fruits and protein. Limits her carbs and sugars, does not drink soda. She has limited her wine and alcohol.  The patient states her exercise level is moderate to vigorous exercising 4-5 times a week.   The patient's tobacco use is:  Social History   Tobacco Use  Smoking Status Never  Smokeless Tobacco Never   She has been exposed to passive smoke. The patient's alcohol use is:  Social History   Substance and Sexual Activity  Alcohol Use No   Additional information: Last pap 10/17/2021, next one scheduled for 10/17/2024.    Review of Systems  Constitutional: Negative.   HENT:  Negative.    Eyes: Negative.   Respiratory: Negative.    Cardiovascular: Negative.   Gastrointestinal: Negative.   Endocrine: Negative.   Genitourinary: Negative.   Musculoskeletal: Negative.   Skin: Negative.   Allergic/Immunologic: Negative.   Neurological: Negative.   Hematological: Negative.   Psychiatric/Behavioral: Negative.      Today's Vitals   12/27/21 0834  BP: 124/82  Pulse: 60  Temp: 98.1 F (36.7 C)  Weight: 208 lb 12.8 oz (94.7 kg)  Height: _0  (1.626 m)    Body mass index is 35.84 kg/m.  Wt Readings from Last 3 Encounters:  12/27/21 208 lb 12.8 oz (94.7 kg)  11/07/21 211 lb 12.8 oz (96.1 kg)  07/18/21 205 lb (93 kg)    BP Readings from Last 3 Encounters:  12/27/21 124/82  11/07/21 134/86  09/21/21 (!) 147/91    Objective:  Physical Exam Vitals reviewed.  Constitutional:      General: She is not in acute distress.    Appearance: Normal appearance. She is well-developed. She is obese.  HENT:     Head: Normocephalic and atraumatic.     Right Ear: Hearing, tympanic membrane, ear canal and external ear normal. There is no impacted cerumen.     Left Ear: Hearing, tympanic membrane, ear canal and external ear normal. There is no impacted cerumen.     Nose:     Comments: Deferred - masked    Mouth/Throat:     Comments: Deferred - masked Eyes:     General: Lids are normal.     Extraocular Movements: Extraocular movements intact.     Conjunctiva/sclera: Conjunctivae normal.     Pupils: Pupils are equal, round, and reactive to light.     Funduscopic exam:    Right eye: No papilledema.        Left eye: No papilledema.  Neck:     Thyroid: No thyroid mass.     Vascular: No carotid bruit.  Cardiovascular:     Rate and Rhythm: Normal rate and regular rhythm.     Pulses: Normal pulses.     Heart sounds: Normal heart sounds. No murmur heard. Pulmonary:     Effort: Pulmonary effort is normal. No respiratory distress.     Breath sounds: Normal breath sounds. No wheezing.  Chest:     Chest wall: No mass.  Breasts:    Tanner Score is 5.     Right: No mass or tenderness.     Left: Normal. No mass or tenderness.  Abdominal:     General: Abdomen is flat. Bowel sounds are normal. There is no distension.     Palpations: Abdomen is soft.     Tenderness: There is no abdominal tenderness.  Genitourinary:    Rectum: Guaiac result negative.  Musculoskeletal:        General: No swelling. Normal range of motion.     Cervical back: Full  passive range of motion without pain, normal range of motion and neck supple.     Right lower leg: No edema.     Left lower leg: No edema.  Lymphadenopathy:     Upper Body:     Right upper body: No supraclavicular, axillary or pectoral adenopathy.     Left upper body: No supraclavicular, axillary or pectoral adenopathy.  Skin:    General: Skin is warm and dry.     Capillary Refill: Capillary refill takes less than 2 seconds.  Neurological:     General: No focal deficit present.  Mental Status: She is alert and oriented to person, place, and time.     Cranial Nerves: No cranial nerve deficit.     Sensory: No sensory deficit.     Motor: No weakness.  Psychiatric:        Mood and Affect: Mood normal.        Behavior: Behavior normal.        Thought Content: Thought content normal.        Judgment: Judgment normal.        Assessment And Plan:     1. Encounter for general adult medical examination w/o abnormal findings Behavior modifications discussed and diet history reviewed.   Pt will continue to exercise regularly and modify diet with low GI, plant based foods and decrease intake of processed foods.  Recommend intake of daily multivitamin, Vitamin D, and calcium.  Recommend mammogram and colonoscopy for preventive screenings, as well as recommend immunizations that include influenza, TDAP (up to date)  2. Essential hypertension Comments: Blood pressure is controlled, continue current medications - POCT Urinalysis Dipstick (81002) - Microalbumin / Creatinine Urine Ratio - EKG 12-Lead - CMP14+EGFR - SUMAtriptan (IMITREX) 50 MG tablet; Take 1 tablet (50 mg total) by mouth once as needed for migraine. May repeat in 2 hours if headache persists or recurs.  Dispense: 30 tablet; Refill: 2  3. Lack of concentration Comments: Will refer to psychiatry for evaluation - Ambulatory referral to Psychiatry  4. Vitamin D deficiency Will check vitamin D level and supplement as needed.     Also encouraged to spend 15 minutes in the sun daily.  - VITAMIN D 25 Hydroxy (Vit-D Deficiency, Fractures)  5. Episodic tension-type headache, not intractable Comments: Will trial her on sumitriptan, instructions given. She is to monitor her blood pressure as well.   6. Class 2 severe obesity due to excess calories with serious comorbidity and body mass index (BMI) of 35.0 to 35.9 in adult [facility]) Comments: Continue exercising and consider adding a day to her exercise regimen. Focus on losing inches as well. Waist circumference 36.75 cm.  She is encouraged to strive for BMI less than 30 to decrease cardiac risk. Advised to aim for at least 150 minutes of exercise per week.  - Hemoglobin A1c - Lipid panel  7. Other long term (current) drug therapy - CBC    Patient was given opportunity to ask questions. Patient verbalized understanding of the plan and was able to repeat key elements of the plan. All questions were answered to their satisfaction.   Olivia Brine, FNP   I, Olivia Brine, FNP, have reviewed all documentation for this visit. The documentation on 12/27/21 for the exam, diagnosis, procedures, and orders are all accurate and complete.   THE PATIENT IS ENCOURAGED TO PRACTICE SOCIAL DISTANCING DUE TO THE COVID-19 PANDEMIC.

## 2021-12-28 LAB — CBC
Hematocrit: 39.6 % (ref 34.0–46.6)
Hemoglobin: 13.1 g/dL (ref 11.1–15.9)
MCH: 31 pg (ref 26.6–33.0)
MCHC: 33.1 g/dL (ref 31.5–35.7)
MCV: 94 fL (ref 79–97)
Platelets: 338 10*3/uL (ref 150–450)
RBC: 4.22 x10E6/uL (ref 3.77–5.28)
RDW: 12.1 % (ref 11.7–15.4)
WBC: 4.6 10*3/uL (ref 3.4–10.8)

## 2021-12-28 LAB — CMP14+EGFR
ALT: 18 IU/L (ref 0–32)
AST: 19 IU/L (ref 0–40)
Albumin/Globulin Ratio: 2 (ref 1.2–2.2)
Albumin: 4.6 g/dL (ref 3.8–4.8)
Alkaline Phosphatase: 43 IU/L — ABNORMAL LOW (ref 44–121)
BUN/Creatinine Ratio: 12 (ref 9–23)
BUN: 13 mg/dL (ref 6–20)
Bilirubin Total: 0.4 mg/dL (ref 0.0–1.2)
CO2: 24 mmol/L (ref 20–29)
Calcium: 10.2 mg/dL (ref 8.7–10.2)
Chloride: 103 mmol/L (ref 96–106)
Creatinine, Ser: 1.06 mg/dL — ABNORMAL HIGH (ref 0.57–1.00)
Globulin, Total: 2.3 g/dL (ref 1.5–4.5)
Glucose: 92 mg/dL (ref 70–99)
Potassium: 4.1 mmol/L (ref 3.5–5.2)
Sodium: 140 mmol/L (ref 134–144)
Total Protein: 6.9 g/dL (ref 6.0–8.5)
eGFR: 69 mL/min/{1.73_m2} (ref >59)

## 2021-12-28 LAB — VITAMIN D 25 HYDROXY (VIT D DEFICIENCY, FRACTURES): Vit D, 25-Hydroxy: 54.3 ng/mL (ref 30.0–100.0)

## 2021-12-28 LAB — LIPID PANEL
Chol/HDL Ratio: 2.8 ratio (ref 0.0–4.4)
Cholesterol, Total: 154 mg/dL (ref 100–199)
HDL: 55 mg/dL (ref >39)
LDL Chol Calc (NIH): 88 mg/dL (ref 0–99)
Triglycerides: 55 mg/dL (ref 0–149)
VLDL Cholesterol Cal: 11 mg/dL (ref 5–40)

## 2021-12-28 LAB — HEMOGLOBIN A1C
Est. average glucose Bld gHb Est-mCnc: 103 mg/dL
Hgb A1c MFr Bld: 5.2 % (ref 4.8–5.6)

## 2021-12-28 LAB — MICROALBUMIN / CREATININE URINE RATIO
Creatinine, Urine: 228 mg/dL
Microalb/Creat Ratio: 2 mg/g creat (ref 0–29)
Microalbumin, Urine: 3.7 ug/mL

## 2021-12-31 ENCOUNTER — Encounter: Payer: Self-pay | Admitting: Nurse Practitioner

## 2022-01-04 ENCOUNTER — Other Ambulatory Visit: Payer: Self-pay | Admitting: Nurse Practitioner

## 2022-01-09 ENCOUNTER — Other Ambulatory Visit: Payer: Self-pay

## 2022-01-09 MED ORDER — OMEPRAZOLE 40 MG PO CPDR: DELAYED_RELEASE_CAPSULE | ORAL | 1 refills | Status: DC

## 2022-01-17 ENCOUNTER — Other Ambulatory Visit: Payer: Self-pay | Admitting: Nurse Practitioner

## 2022-01-22 ENCOUNTER — Encounter: Payer: Self-pay | Admitting: Nurse Practitioner

## 2022-02-11 ENCOUNTER — Encounter: Payer: Self-pay | Admitting: Nurse Practitioner

## 2022-02-11 ENCOUNTER — Ambulatory Visit: Payer: 59 | Admitting: Nurse Practitioner

## 2022-02-11 VITALS — BP 128/72 | HR 73 | Temp 98.2°F | Ht 64.0 in | Wt 202.0 lb

## 2022-02-11 DIAGNOSIS — I10 Essential (primary) hypertension: Secondary | ICD-10-CM | POA: Diagnosis not present

## 2022-02-11 DIAGNOSIS — Z6834 Body mass index (BMI) 34.0-34.9, adult: Secondary | ICD-10-CM | POA: Diagnosis not present

## 2022-02-11 DIAGNOSIS — E6609 Other obesity due to excess calories: Secondary | ICD-10-CM

## 2022-02-11 NOTE — Progress Notes (Signed)
?Clinical biochemist as a Neurosurgeon for [employer], FNP.,have documented all relevant documentation on the behalf of Arnette Felts, FNP,as directed by  Arnette Felts, FNP while in the presence of Arnette Felts, FNP. ? ?This visit occurred during the SARS-CoV-2 public health emergency.  Safety protocols were in place, including screening questions prior to the visit, additional usage of staff PPE, and extensive cleaning of exam room while observing appropriate contact time as indicated for disinfecting solutions. ? ?Subjective:  ?  ? Patient ID: [name] , female    DOB: 02-18-1981 , 41 y.o.   MRN: [id number] ? ? ?Chief Complaint  ?Patient presents with  ? Hypertension  ? ? ?HPI ? ?Patient presents for HTN follow up.  ? ?Wt Readings from Last 3 Encounters: ?02/11/22 : 202 lb (91.6 kg) ?12/27/21 : 208 lb 12.8 oz (94.7 kg) ?11/07/21 : 211 lb 12.8 oz (96.1 kg) ? ? ? ?Hypertension ?This is a new problem. The current episode started 1 to 4 weeks ago. The problem has been gradually improving since onset. Pertinent negatives include no anxiety, chest pain, headaches or palpitations. Agents associated with hypertension include NSAIDs. Risk factors for coronary artery disease include sedentary lifestyle and obesity. Past treatments include diuretics and calcium channel blockers. Compliance problems include exercise (last 1.5 months has not been going to exercise).  There is no history of angina.   ? ?Past Medical History:  ?Diagnosis Date  ? Gestational hypertension   ?  ? ?Family History  ?Problem Relation Age of Onset  ? Hypertension Mother   ? Hypertension Father   ? Other Neg Hx   ? ? ? ?Current Outpatient Medications:  ?  amLODipine (NORVASC) 2.5 MG tablet, TAKE 1 TABLET BY MOUTH EVERY DAY, Disp: 90 tablet, Rfl: 0 ?  cetirizine (ZYRTEC) 10 MG tablet, Take 1 tablet (10 mg total) by mouth daily., Disp: 90 tablet, Rfl: 1 ?  hydrochlorothiazide (HYDRODIURIL) 25 MG tablet, TAKE 1 TABLET BY MOUTH EVERY DAY, Disp: 90  tablet, Rfl: 1 ?  Magnesium 250 MG TABS, Take 1 tablet (250 mg total) by mouth daily., Disp: 30 tablet, Rfl: 2 ?  omeprazole (PRILOSEC) 40 MG capsule, TAKE 1 CAPSULE BY MOUTH EVERY DAY, Disp: 90 capsule, Rfl: 1 ?  SUMAtriptan (IMITREX) 50 MG tablet, Take 1 tablet (50 mg total) by mouth once as needed for migraine. May repeat in 2 hours if headache persists or recurs., Disp: 30 tablet, Rfl: 2 ?  Vitamin D, Ergocalciferol, (DRISDOL) 1.25 MG (50000 UNIT) CAPS capsule, Take 1 capsule (50,000 Units total) by mouth 2 (two) times a week., Disp: 24 capsule, Rfl: 1  ? ?Allergies  ?Allergen Reactions  ? Diflucan [Fluconazole] Other (See Comments)  ?  Skin on her hand burned off.  ?  ? ?Review of Systems  ?Constitutional: Negative.   ?Respiratory: Negative.    ?Cardiovascular: Negative.  Negative for chest pain, palpitations and leg swelling.  ?Gastrointestinal: Negative.   ?Neurological: Negative.  Negative for dizziness and headaches.  ?Psychiatric/Behavioral: Negative.     ? ?Today's Vitals  ? 02/11/22 1555  ?BP: 128/72  ?Pulse: 73  ?Temp: 98.2 ?F (36.8 ?C)  ?TempSrc: Oral  ?Weight: 202 lb (91.6 kg)  ?Height: 5\' 4"  (1.626 m)  ? ?Body mass index is 34.67 kg/m?.  ?Wt Readings from Last 3 Encounters:  ?02/11/22 202 lb (91.6 kg)  ?12/27/21 208 lb 12.8 oz (94.7 kg)  ?11/07/21 211 lb 12.8 oz (96.1 kg)  ? ? ?Objective:  ?Physical Exam ?Vitals reviewed.  ?  Constitutional:   ?   General: She is not in acute distress. ?   Appearance: Normal appearance.  ?Cardiovascular:  ?   Pulses: Normal pulses.  ?   Heart sounds: Normal heart sounds. No murmur heard. ?Neurological:  ?   General: No focal deficit present.  ?   Mental Status: She is alert and oriented to person, place, and time.  ?   Cranial Nerves: No cranial nerve deficit.  ?   Motor: No weakness.  ?Psychiatric:     ?   Mood and Affect: Mood normal.     ?   Behavior: Behavior normal.     ?   Thought Content: Thought content normal.     ?   Judgment: Judgment normal.  ?  ? ?    ?Assessment And Plan:  ?   ?1. Essential hypertension ?Comments: Blood pressure is normal, continue current medications.  ? ?2. Class 1 obesity due to excess calories with serious comorbidity and body mass index (BMI) of 34.0 to 34.9 in adult ?Comments: She will call back if she would like Saxenda.  ?She is encouraged to strive for BMI less than 30 to decrease cardiac risk. Advised to aim for at least 150 minutes of exercise per week. Her body make up is muscle as well.  ? ? ? ?Patient was given opportunity to ask questions. Patient verbalized understanding of the plan and was able to repeat key elements of the plan. All questions were answered to their satisfaction.  ?Arnette Felts, FNP  ? ?I, Arnette Felts, FNP, have reviewed all documentation for this visit. The documentation on 02/11/22 for the exam, diagnosis, procedures, and orders are all accurate and complete.  ? ?IF YOU HAVE BEEN REFERRED TO A SPECIALIST, IT MAY TAKE 1-2 WEEKS TO SCHEDULE/PROCESS THE REFERRAL. IF YOU HAVE NOT HEARD FROM US/SPECIALIST IN TWO WEEKS, PLEASE GIVE [place] A CALL AT [phone] X [phone].  ? ?THE PATIENT IS ENCOURAGED TO PRACTICE SOCIAL DISTANCING DUE TO THE COVID-19 PANDEMIC.   ?

## 2022-02-11 NOTE — Patient Instructions (Addendum)
Hypertension, Adult ?High blood pressure (hypertension) is when the force of blood pumping through the arteries is too strong. The arteries are the blood vessels that carry blood from the heart throughout the body. Hypertension forces the heart to work harder to pump blood and may cause arteries to become narrow or stiff. Untreated or uncontrolled hypertension can cause a heart attack, heart failure, a stroke, kidney disease, and other problems. ?A blood pressure reading consists of a higher number over a lower number. Ideally, your blood pressure should be below 120/80. The first ("top") number is called the systolic pressure. It is a measure of the pressure in your arteries as your heart beats. The second ("bottom") number is called the diastolic pressure. It is a measure of the pressure in your arteries as the heart relaxes. ?What are the causes? ?The exact cause of this condition is not known. There are some conditions that result in or are related to high blood pressure. ?What increases the risk? ?Some risk factors for high blood pressure are under your control. The following factors may make you more likely to develop this condition: ?Smoking. ?Having type 2 diabetes mellitus, high cholesterol, or both. ?Not getting enough exercise or physical activity. ?Being overweight. ?Having too much fat, sugar, calories, or salt (sodium) in your diet. ?Drinking too much alcohol. ?Some risk factors for high blood pressure may be difficult or impossible to change. Some of these factors include: ?Having chronic kidney disease. ?Having a family history of high blood pressure. ?Age. Risk increases with age. ?Race. You may be at higher risk if you are African American. ?Gender. Men are at higher risk than women before age 57. After age 32, women are at higher risk than men. ?Having obstructive sleep apnea. ?Stress. ?What are the signs or symptoms? ?High blood pressure may not cause symptoms. Very high blood pressure  (hypertensive crisis) may cause: ?Headache. ?Anxiety. ?Shortness of breath. ?Nosebleed. ?Nausea and vomiting. ?Vision changes. ?Severe chest pain. ?Seizures. ?How is this diagnosed? ?This condition is diagnosed by measuring your blood pressure while you are seated, with your arm resting on a flat surface, your legs uncrossed, and your feet flat on the floor. The cuff of the blood pressure monitor will be placed directly against the skin of your upper arm at the level of your heart. It should be measured at least twice using the same arm. Certain conditions can cause a difference in blood pressure between your right and left arms. ?Certain factors can cause blood pressure readings to be lower or higher than normal for a short period of time: ?When your blood pressure is higher when you are in a health care provider's office than when you are at home, this is called white coat hypertension. Most people with this condition do not need medicines. ?When your blood pressure is higher at home than when you are in a health care provider's office, this is called masked hypertension. Most people with this condition may need medicines to control blood pressure. ?If you have a high blood pressure reading during one visit or you have normal blood pressure with other risk factors, you may be asked to: ?Return on a different day to have your blood pressure checked again. ?Monitor your blood pressure at home for 1 week or longer. ?If you are diagnosed with hypertension, you may have other blood or imaging tests to help your health care provider understand your overall risk for other conditions. ?How is this treated? ?This condition is treated by making  healthy lifestyle changes, such as eating healthy foods, exercising more, and reducing your alcohol intake. Your health care provider may prescribe medicine if lifestyle changes are not enough to get your blood pressure under control, and if: ?Your systolic blood pressure is above  130. ?Your diastolic blood pressure is above 80. ?Your personal target blood pressure may vary depending on your medical conditions, your age, and other factors. ?Follow these instructions at home: ?Eating and drinking ? ?Eat a diet that is high in fiber and potassium, and low in sodium, added sugar, and fat. An example eating plan is called the DASH (Dietary Approaches to Stop Hypertension) diet. To eat this way: ?Eat plenty of fresh fruits and vegetables. Try to fill one half of your plate at each meal with fruits and vegetables. ?Eat whole grains, such as whole-wheat pasta, brown rice, or whole-grain bread. Fill about one fourth of your plate with whole grains. ?Eat or drink low-fat dairy products, such as skim milk or low-fat yogurt. ?Avoid fatty cuts of meat, processed or cured meats, and poultry with skin. Fill about one fourth of your plate with lean proteins, such as fish, chicken without skin, beans, eggs, or tofu. ?Avoid pre-made and processed foods. These tend to be higher in sodium, added sugar, and fat. ?Reduce your daily sodium intake. Most people with hypertension should eat less than 1,500 mg of sodium a day. ?Do not drink alcohol if: ?Your health care provider tells you not to drink. ?You are pregnant, may be pregnant, or are planning to become pregnant. ?If you drink alcohol: ?Limit how much you use to: ?0-1 drink a day for women. ?0-2 drinks a day for men. ?Be aware of how much alcohol is in your drink. In the [place] one drink equals one 12 oz bottle of beer (355 mL), one 5 oz glass of wine (148 mL), or one 1? oz glass of hard liquor (44 mL). ?Lifestyle ? ?Work with your health care provider to maintain a healthy body weight or to lose weight. Ask what an ideal weight is for you. ?Get at least 30 minutes of exercise most days of the week. Activities may include walking, swimming, or biking. ?Include exercise to strengthen your muscles (resistance exercise), such as Pilates or lifting weights, as  part of your weekly exercise routine. Try to do these types of exercises for 30 minutes at least 3 days a week. ?Do not use any products that contain nicotine or tobacco, such as cigarettes, e-cigarettes, and chewing tobacco. If you need help quitting, ask your health care provider. ?Monitor your blood pressure at home as told by your health care provider. ?Keep all follow-up visits as told by your health care provider. This is important. ?Medicines ?Take over-the-counter and prescription medicines only as told by your health care provider. Follow directions carefully. Blood pressure medicines must be taken as prescribed. ?Do not skip doses of blood pressure medicine. Doing this puts you at risk for problems and can make the medicine less effective. ?Ask your health care provider about side effects or reactions to medicines that you should watch for. ?Contact a health care provider if you: ?Think you are having a reaction to a medicine you are taking. ?Have headaches that keep coming back (recurring). ?Feel dizzy. ?Have swelling in your ankles. ?Have trouble with your vision. ?Get help right away if you: ?Develop a severe headache or confusion. ?Have unusual weakness or numbness. ?Feel faint. ?Have severe pain in your chest or abdomen. ?Vomit repeatedly. ?Have trouble  breathing. ?Summary ?Hypertension is when the force of blood pumping through your arteries is too strong. If this condition is not controlled, it may put you at risk for serious complications. ?Your personal target blood pressure may vary depending on your medical conditions, your age, and other factors. For most people, a normal blood pressure is less than 120/80. ?Hypertension is treated with lifestyle changes, medicines, or a combination of both. Lifestyle changes include losing weight, eating a healthy, low-sodium diet, exercising more, and limiting alcohol. ?This information is not intended to replace advice given to you by your health care  provider. Make sure you discuss any questions you have with your health care provider. ?Document Revised: 07/08/2018 Document Reviewed: 07/08/2018 ?Elsevier Patient Education ? 2022 Else[employer] Inc[employer] ? ? ?Liraglutide Injecti

## 2022-02-22 ENCOUNTER — Other Ambulatory Visit: Payer: Self-pay | Admitting: Nurse Practitioner

## 2022-02-26 ENCOUNTER — Encounter: Payer: Self-pay | Admitting: Nurse Practitioner

## 2022-02-26 ENCOUNTER — Telehealth (INDEPENDENT_AMBULATORY_CARE_PROVIDER_SITE_OTHER): Payer: 59 | Admitting: Nurse Practitioner

## 2022-02-26 VITALS — Ht 64.0 in | Wt 200.0 lb

## 2022-02-26 DIAGNOSIS — R051 Acute cough: Secondary | ICD-10-CM | POA: Diagnosis not present

## 2022-02-26 DIAGNOSIS — R0981 Nasal congestion: Secondary | ICD-10-CM

## 2022-02-26 DIAGNOSIS — J209 Acute bronchitis, unspecified: Secondary | ICD-10-CM | POA: Diagnosis not present

## 2022-02-26 MED ORDER — IPRATROPIUM BROMIDE 0.06 % NA SOLN: 2.0000 | Freq: Three times a day (TID) | NASAL | 2 refills | Status: DC

## 2022-02-26 MED ORDER — PREDNISONE 10 MG (21) PO TBPK: ORAL_TABLET | ORAL | 0 refills | Status: DC

## 2022-02-26 NOTE — Progress Notes (Signed)
? ?Virtual Visit via [place]  ? ?This visit type was conducted due to national recommendations for restrictions regarding the COVID-19 Pandemic (e.g. social distancing) in an effort to limit this patient's exposure and mitigate transmission in our community.  Due to her co-morbid illnesses, this patient is at least at moderate risk for complications without adequate follow up.  This format is felt to be most appropriate for this patient at this time.  All issues noted in this document were discussed and addressed.  A limited physical exam was performed with this format.   ? ?This visit type was conducted due to national recommendations for restrictions regarding the COVID-19 Pandemic (e.g. social distancing) in an effort to limit this patient's exposure and mitigate transmission in our community.  Patients identity confirmed using two different identifiers.  This format is felt to be most appropriate for this patient at this time.  All issues noted in this document were discussed and addressed.  No physical exam was performed (except for noted visual exam findings with Video Visits).   ? ?Date:  02/26/2022  ? ?ID:  [name], DOB 05-May-1981, MRN [id number] ? ?Patient Location:  ?Home - spoke with Virl Axe ? ?Provider location:   ?Office ? ? ? ?Chief Complaint:  runny nose and cough ? ?History of Present Illness:   ? ?[name] is a 41 y.o. female who presents via video conferencing for a telehealth visit today.   ? ?The patient does have symptoms concerning for COVID-19 infection (fever, chills, cough, or new shortness of breath).  ? ?Was sick 2 weeks ago and got better and then on Saturday began having a sore throat. Coughing with secretions - yellowish (when she blows her nose). She is taking Zyrtec.  She has used Afrin Nasal. Her home covid test was negative.  ?  ? ?Past Medical History:  ?Diagnosis Date  ? Gestational hypertension   ? ?Past Surgical History:  ?Procedure Laterality Date  ?  CESAREAN SECTION    ? INGUINAL HERNIA REPAIR    ?  ? ?Current Meds  ?Medication Sig  ? amLODipine (NORVASC) 2.5 MG tablet TAKE 1 TABLET BY MOUTH EVERY DAY  ? cetirizine (ZYRTEC) 10 MG tablet Take 1 tablet (10 mg total) by mouth daily.  ? hydrochlorothiazide (HYDRODIURIL) 25 MG tablet TAKE 1 TABLET BY MOUTH EVERY DAY  ? ipratropium (ATROVENT) 0.06 % nasal spray Place 2 sprays into the nose 3 (three) times daily.  ? omeprazole (PRILOSEC) 40 MG capsule TAKE 1 CAPSULE BY MOUTH EVERY DAY  ? predniSONE (STERAPRED UNI-PAK 21 TAB) 10 MG (21) TBPK tablet Take as directed  ? SUMAtriptan (IMITREX) 50 MG tablet Take 1 tablet (50 mg total) by mouth once as needed for migraine. May repeat in 2 hours if headache persists or recurs.  ? Vitamin D, Ergocalciferol, (DRISDOL) 1.25 MG (50000 UNIT) CAPS capsule Take 1 capsule (50,000 Units total) by mouth 2 (two) times a week.  ?  ? ?Allergies:   Diflucan [fluconazole]  ? ?Social History  ? ?Tobacco Use  ? Smoking status: Never  ? Smokeless tobacco: Never  ?Substance Use Topics  ? Alcohol use: No  ? Drug use: No  ?  ? ?Family Hx: ?The patient's family history includes Hypertension in her father and mother. There is no history of Other. ? ?ROS:   ?Please see the history of present illness.    ?Review of Systems  ?Constitutional: Negative.   ?Cardiovascular: Negative.   ?Skin: Negative.   ?  Neurological: Negative.   ?Psychiatric/Behavioral: Negative.     ?All other systems reviewed and are negative. ? ? ?Labs/Other Tests and Data Reviewed:   ? ?Recent Labs: ?12/27/2021: ALT 18; BUN 13; Creatinine, Ser 1.06; Hemoglobin 13.1; Platelets 338; Potassium 4.1; Sodium 140  ? ?Recent Lipid Panel ?Lab Results  ?Component Value Date/Time  ? CHOL 154 12/27/2021 10:08 AM  ? TRIG 55 12/27/2021 10:08 AM  ? HDL 55 12/27/2021 10:08 AM  ? CHOLHDL 2.8 12/27/2021 10:08 AM  ? LDLCALC 88 12/27/2021 10:08 AM  ? ? ?Wt Readings from Last 3 Encounters:  ?02/26/22 200 lb (90.7 kg)  ?02/11/22 202 lb (91.6 kg)   ?12/27/21 208 lb 12.8 oz (94.7 kg)  ?  ? ?Exam:   ? ?Vital Signs:  Ht 5\' 4"  (1.626 m)   Wt 200 lb (90.7 kg)   BMI 34.33 kg/m?   ? ? ?Physical Exam ?Vitals reviewed.  ?Constitutional:   ?   General: She is not in acute distress. ?   Appearance: Normal appearance. She is obese.  ?Cardiovascular:  ?   Rate and Rhythm: Normal rate and regular rhythm.  ?   Pulses: Normal pulses.  ?   Heart sounds: Normal heart sounds. No murmur heard. ?Pulmonary:  ?   Effort: Pulmonary effort is normal. No respiratory distress.  ?   Breath sounds: Normal breath sounds. No wheezing.  ?Skin: ?   General: Skin is warm and dry.  ?   Capillary Refill: Capillary refill takes less than 2 seconds.  ?Neurological:  ?   General: No focal deficit present.  ?   Mental Status: She is alert and oriented to person, place, and time.  ?   Cranial Nerves: No cranial nerve deficit.  ?   Motor: No weakness.  ?Psychiatric:     ?   Mood and Affect: Mood normal.     ?   Behavior: Behavior normal.     ?   Thought Content: Thought content normal.     ?   Judgment: Judgment normal.  ? ? ?ASSESSMENT & PLAN:   ? ?1. Acute bronchitis, unspecified organism ?This is likely related to the increased pollen ?- predniSONE (STERAPRED UNI-PAK 21 TAB) 10 MG (21) TBPK tablet; Take as directed  Dispense: 21 tablet; Refill: 0 ? ?2. Acute cough ? ?- ipratropium (ATROVENT) 0.06 % nasal spray; Place 2 sprays into the nose 3 (three) times daily.  Dispense: 15 mL; Refill: 2 ?- predniSONE (STERAPRED UNI-PAK 21 TAB) 10 MG (21) TBPK tablet; Take as directed  Dispense: 21 tablet; Refill: 0 ? ?3. Nasal congestion ? ?- ipratropium (ATROVENT) 0.06 % nasal spray; Place 2 sprays into the nose 3 (three) times daily.  Dispense: 15 mL; Refill: 2 ? ? ?COVID-19 Education: ?The signs and symptoms of COVID-19 were discussed with the patient and how to seek care for testing (follow up with PCP or arrange E-visit).  The importance of social distancing was discussed today. ? ?Patient Risk:   ?After  full review of this patients clinical status, I feel that they are at least moderate risk at this time. ? ?Time:   ?Today, I have spent 6.16 minutes/ seconds with the patient with telehealth technology discussing above diagnoses.   ? ? ?Medication Adjustments/Labs and Tests Ordered: ?Current medicines are reviewed at length with the patient today.  Concerns regarding medicines are outlined above.  ? ?Tests Ordered: ?No orders of the defined types were placed in this encounter. ? ? ?Medication Changes: ?Meds ordered  this encounter  ?Medications  ? ipratropium (ATROVENT) 0.06 % nasal spray  ?  Sig: Place 2 sprays into the nose 3 (three) times daily.  ?  Dispense:  15 mL  ?  Refill:  2  ? predniSONE (STERAPRED UNI-PAK 21 TAB) 10 MG (21) TBPK tablet  ?  Sig: Take as directed  ?  Dispense:  21 tablet  ?  Refill:  0  ? ? ?Disposition:  Follow up prn ? ?Signed, ?Minette Brine, FNP  ?  ?

## 2022-02-27 ENCOUNTER — Encounter: Payer: Self-pay | Admitting: Nurse Practitioner

## 2022-03-05 ENCOUNTER — Encounter: Payer: Self-pay | Admitting: Nurse Practitioner

## 2022-03-12 ENCOUNTER — Other Ambulatory Visit: Payer: Self-pay | Admitting: Nurse Practitioner

## 2022-03-12 MED ORDER — ALBUTEROL SULFATE (2.5 MG/3ML) 0.083% IN NEBU
2.5000 mg | INHALATION_SOLUTION | RESPIRATORY_TRACT | 2 refills | Status: DC | PRN
Start: 2022-03-12 — End: 2023-01-02

## 2022-03-13 ENCOUNTER — Other Ambulatory Visit: Payer: Self-pay | Admitting: Nurse Practitioner

## 2022-03-13 DIAGNOSIS — R051 Acute cough: Secondary | ICD-10-CM

## 2022-03-13 MED ORDER — BENZONATATE 100 MG PO CAPS: 100.0000 mg | ORAL_CAPSULE | Freq: Four times a day (QID) | ORAL | 1 refills | Status: DC | PRN

## 2022-03-16 ENCOUNTER — Other Ambulatory Visit: Payer: Self-pay | Admitting: Nurse Practitioner

## 2022-03-18 ENCOUNTER — Other Ambulatory Visit: Payer: Self-pay | Admitting: Nurse Practitioner

## 2022-03-31 ENCOUNTER — Other Ambulatory Visit: Payer: Self-pay | Admitting: Nurse Practitioner

## 2022-03-31 DIAGNOSIS — I10 Essential (primary) hypertension: Secondary | ICD-10-CM

## 2022-05-24 ENCOUNTER — Other Ambulatory Visit: Payer: Self-pay | Admitting: Nurse Practitioner

## 2022-06-13 ENCOUNTER — Ambulatory Visit: Payer: 59 | Admitting: Nurse Practitioner

## 2022-08-31 ENCOUNTER — Other Ambulatory Visit: Payer: Self-pay | Admitting: Nurse Practitioner

## 2022-09-24 ENCOUNTER — Encounter: Payer: Self-pay | Admitting: Nurse Practitioner

## 2022-09-24 ENCOUNTER — Other Ambulatory Visit: Payer: Self-pay

## 2022-09-24 DIAGNOSIS — I10 Essential (primary) hypertension: Secondary | ICD-10-CM

## 2022-09-24 MED ORDER — HYDROCHLOROTHIAZIDE 25 MG PO TABS
25.0000 mg | ORAL_TABLET | Freq: Every day | ORAL | 1 refills | Status: DC
Start: 2022-09-24 — End: 2023-01-02

## 2022-10-30 ENCOUNTER — Encounter: Payer: Self-pay | Admitting: Nurse Practitioner

## 2022-11-18 ENCOUNTER — Ambulatory Visit: Payer: 59 | Admitting: Nurse Practitioner

## 2022-11-18 ENCOUNTER — Encounter: Payer: Self-pay | Admitting: Nurse Practitioner

## 2022-11-21 ENCOUNTER — Encounter: Payer: Self-pay | Admitting: Nurse Practitioner

## 2022-11-21 ENCOUNTER — Ambulatory Visit: Payer: BC Managed Care – PPO | Admitting: Nurse Practitioner

## 2022-11-21 VITALS — BP 118/68 | HR 60 | Temp 98.6°F | Ht 64.0 in | Wt 184.0 lb

## 2022-11-21 DIAGNOSIS — I10 Essential (primary) hypertension: Secondary | ICD-10-CM | POA: Diagnosis not present

## 2022-11-21 DIAGNOSIS — R42 Dizziness and giddiness: Secondary | ICD-10-CM

## 2022-11-21 MED ORDER — MECLIZINE HCL 12.5 MG PO TABS
12.5000 mg | ORAL_TABLET | Freq: Three times a day (TID) | ORAL | 0 refills | Status: DC | PRN
Start: 2022-11-21 — End: 2023-10-21

## 2022-11-21 NOTE — Patient Instructions (Addendum)
Hypertension, Adult High blood pressure (hypertension) is when the force of blood pumping through the arteries is too strong. The arteries are the blood vessels that carry blood from the heart throughout the body. Hypertension forces the heart to work harder to pump blood and may cause arteries to become narrow or stiff. Untreated or uncontrolled hypertension can lead to a heart attack, heart failure, a stroke, kidney disease, and other problems. A blood pressure reading consists of a higher number over a lower number. Ideally, your blood pressure should be below 120/80. The first ("top") number is called the systolic pressure. It is a measure of the pressure in your arteries as your heart beats. The second ("bottom") number is called the diastolic pressure. It is a measure of the pressure in your arteries as the heart relaxes. What are the causes? The exact cause of this condition is not known. There are some conditions that result in high blood pressure. What increases the risk? Certain factors may make you more likely to develop high blood pressure. Some of these risk factors are under your control, including: Smoking. Not getting enough exercise or physical activity. Being overweight. Having too much fat, sugar, calories, or salt (sodium) in your diet. Drinking too much alcohol. Other risk factors include: Having a personal history of heart disease, diabetes, high cholesterol, or kidney disease. Stress. Having a family history of high blood pressure and high cholesterol. Having obstructive sleep apnea. Age. The risk increases with age. What are the signs or symptoms? High blood pressure may not cause symptoms. Very high blood pressure (hypertensive crisis) may cause: Headache. Fast or irregular heartbeats (palpitations). Shortness of breath. Nosebleed. Nausea and vomiting. Vision changes. Severe chest pain, dizziness, and seizures. How is this diagnosed? This condition is diagnosed by  measuring your blood pressure while you are seated, with your arm resting on a flat surface, your legs uncrossed, and your feet flat on the floor. The cuff of the blood pressure monitor will be placed directly against the skin of your upper arm at the level of your heart. Blood pressure should be measured at least twice using the same arm. Certain conditions can cause a difference in blood pressure between your right and left arms. If you have a high blood pressure reading during one visit or you have normal blood pressure with other risk factors, you may be asked to: Return on a different day to have your blood pressure checked again. Monitor your blood pressure at home for 1 week or longer. If you are diagnosed with hypertension, you may have other blood or imaging tests to help your health care provider understand your overall risk for other conditions. How is this treated? This condition is treated by making healthy lifestyle changes, such as eating healthy foods, exercising more, and reducing your alcohol intake. You may be referred for counseling on a healthy diet and physical activity. Your health care provider may prescribe medicine if lifestyle changes are not enough to get your blood pressure under control and if: Your systolic blood pressure is above 130. Your diastolic blood pressure is above 80. Your personal target blood pressure may vary depending on your medical conditions, your age, and other factors. Follow these instructions at home: Eating and drinking  Eat a diet that is high in fiber and potassium, and low in sodium, added sugar, and fat. An example of this eating plan is called the DASH diet. DASH stands for Dietary Approaches to Stop Hypertension. To eat this way: Eat   plenty of fresh fruits and vegetables. Try to fill one half of your plate at each meal with fruits and vegetables. Eat whole grains, such as whole-wheat pasta, brown rice, or whole-grain bread. Fill about one  fourth of your plate with whole grains. Eat or drink low-fat dairy products, such as skim milk or low-fat yogurt. Avoid fatty cuts of meat, processed or cured meats, and poultry with skin. Fill about one fourth of your plate with lean proteins, such as fish, chicken without skin, beans, eggs, or tofu. Avoid pre-made and processed foods. These tend to be higher in sodium, added sugar, and fat. Reduce your daily sodium intake. Many people with hypertension should eat less than 1,500 mg of sodium a day. Do not drink alcohol if: Your health care provider tells you not to drink. You are pregnant, may be pregnant, or are planning to become pregnant. If you drink alcohol: Limit how much you have to: 0-1 drink a day for women. 0-2 drinks a day for men. Know how much alcohol is in your drink. In the [place] one drink equals one 12 oz bottle of beer (355 mL), one 5 oz glass of wine (148 mL), or one 1 oz glass of hard liquor (44 mL). Lifestyle  Work with your health care provider to maintain a healthy body weight or to lose weight. Ask what an ideal weight is for you. Get at least 30 minutes of exercise that causes your heart to beat faster (aerobic exercise) most days of the week. Activities may include walking, swimming, or biking. Include exercise to strengthen your muscles (resistance exercise), such as Pilates or lifting weights, as part of your weekly exercise routine. Try to do these types of exercises for 30 minutes at least 3 days a week. Do not use any products that contain nicotine or tobacco. These products include cigarettes, chewing tobacco, and vaping devices, such as e-cigarettes. If you need help quitting, ask your health care provider. Monitor your blood pressure at home as told by your health care provider. Keep all follow-up visits. This is important. Medicines Take over-the-counter and prescription medicines only as told by your health care provider. Follow directions carefully. Blood  pressure medicines must be taken as prescribed. Do not skip doses of blood pressure medicine. Doing this puts you at risk for problems and can make the medicine less effective. Ask your health care provider about side effects or reactions to medicines that you should watch for. Contact a health care provider if you: Think you are having a reaction to a medicine you are taking. Have headaches that keep coming back (recurring). Feel dizzy. Have swelling in your ankles. Have trouble with your vision. Get help right away if you: Develop a severe headache or confusion. Have unusual weakness or numbness. Feel faint. Have severe pain in your chest or abdomen. Vomit repeatedly. Have trouble breathing. These symptoms may be an emergency. Get help right away. Call 911. Do not wait to see if the symptoms will go away. Do not drive yourself to the hospital. Summary Hypertension is when the force of blood pumping through your arteries is too strong. If this condition is not controlled, it may put you at risk for serious complications. Your personal target blood pressure may vary depending on your medical conditions, your age, and other factors. For most people, a normal blood pressure is less than 120/80. Hypertension is treated with lifestyle changes, medicines, or a combination of both. Lifestyle changes include losing weight, eating a healthy,   low-sodium diet, exercising more, and limiting alcohol. This information is not intended to replace advice given to you by your health care provider. Make sure you discuss any questions you have with your health care provider. Document Revised: 09/04/2021 Document Reviewed: 09/04/2021 Elsevier Patient Education  [place] your HCTZ and check your blood pressure two times a day and keep a log, send readings next Thursday.

## 2022-11-21 NOTE — Progress Notes (Signed)
I,Tianna Badgett,acting as a Education administrator for [employer], FNP.,have documented all relevant documentation on the behalf of Minette Brine, FNP,as directed by  Minette Brine, FNP while in the presence of Minette Brine, [place]. Subjective:     Patient ID: [name] , female    DOB: [birth year] , 42 y.o.   MRN: [id number]   Chief Complaint  Patient presents with   Hypertension    HPI  Patient presents for HTN follow up. She has been feeling lightheaded and dizzy which is getting worse throughout the day. She also has a headache.  She has taken aleve and excedrin without relief.  She has a dull pain does not linger. She is drinking approximately 64 oz was drinking more prior to the holidays. Occasionally will feel like she has to hold on to objects when standing. Worse at the end of the day. Blood pressure on Monday was 144/93.    Hypertension This is a new problem. The current episode started 1 to 4 weeks ago. The problem has been gradually improving since onset. Associated symptoms include headaches. Pertinent negatives include no anxiety, chest pain or palpitations. Agents associated with hypertension include NSAIDs. Risk factors for coronary artery disease include sedentary lifestyle and obesity. Past treatments include diuretics and calcium channel blockers. Compliance problems include exercise (last 1.5 months has not been going to exercise).  There is no history of angina.     Past Medical History:  Diagnosis Date   Gestational hypertension      Family History  Problem Relation Age of Onset   Hypertension Mother    Hypertension Father    Other Neg Hx      Current Outpatient Medications:    albuterol (PROVENTIL) (2.5 MG/3ML) 0.083% nebulizer solution, Take 3 mLs (2.5 mg total) by nebulization every 4 (four) hours as needed for wheezing or shortness of breath., Disp: 75 mL, Rfl: 2   amLODipine (NORVASC) 2.5 MG tablet, TAKE 1 TABLET BY MOUTH EVERY DAY, Disp: 90 tablet, Rfl: 0    cetirizine (ZYRTEC) 10 MG tablet, Take 1 tablet (10 mg total) by mouth daily., Disp: 90 tablet, Rfl: 1   hydrochlorothiazide (HYDRODIURIL) 25 MG tablet, Take 1 tablet (25 mg total) by mouth daily., Disp: 90 tablet, Rfl: 1   ipratropium (ATROVENT) 0.06 % nasal spray, Place 2 sprays into the nose 3 (three) times daily., Disp: 15 mL, Rfl: 2   meclizine (ANTIVERT) 12.5 MG tablet, Take 1 tablet (12.5 mg total) by mouth 3 (three) times daily as needed for dizziness., Disp: 30 tablet, Rfl: 0   omeprazole (PRILOSEC) 40 MG capsule, TAKE 1 CAPSULE BY MOUTH EVERY DAY, Disp: 90 capsule, Rfl: 1   Vitamin D, Ergocalciferol, (DRISDOL) 1.25 MG (50000 UNIT) CAPS capsule, Take 1 capsule (50,000 Units total) by mouth 2 (two) times a week., Disp: 24 capsule, Rfl: 1   Allergies  Allergen Reactions   Diflucan [Fluconazole] Other (See Comments)    Skin on her hand burned off.     Review of Systems  Constitutional: Negative.   Respiratory: Negative.    Cardiovascular: Negative.  Negative for chest pain and palpitations.  Gastrointestinal: Negative.   Neurological:  Positive for headaches.  Psychiatric/Behavioral: Negative.       Today's Vitals   11/21/22 1547  BP: 118/68  Pulse: 60  Temp: 98.6 F (37 C)  TempSrc: Oral  Weight: 184 lb (83.5 kg)  Height: 5\' 4"  (1.626 m)   Body mass index is 31.58 kg/m.   Objective:  Physical  Exam Vitals reviewed.  Constitutional:      General: She is not in acute distress.    Appearance: Normal appearance.  Cardiovascular:     Pulses: Normal pulses.     Heart sounds: Normal heart sounds.  Pulmonary:     Effort: Pulmonary effort is normal. No respiratory distress.     Breath sounds: Normal breath sounds. No wheezing.  Skin:    General: Skin is warm and dry.     Capillary Refill: Capillary refill takes less than 2 seconds.  Neurological:     General: No focal deficit present.     Mental Status: She is alert and oriented to person, place, and time.      Cranial Nerves: No cranial nerve deficit.     Sensory: Sensation is intact.     Motor: No weakness.     Coordination: Romberg sign positive (slight positive). Coordination normal. Finger-Nose-Finger Test normal.     Gait: Gait is intact.         Assessment And Plan:     1. Essential hypertension Comments: Blood pressure is controlled today.  Will hold HCTZ and she is to check her blood pressure daily send readings to MyChart next Thursday. - CMP14+EGFR  2. Lightheadedness Comments: Orthostats are normal.  Right TM bulging continue antihistamine and nasal spray.  Meclizine given as needed take when at home may cause drowsiness.  Will check thyroid and hemoglobin. - CBC - TSH - meclizine (ANTIVERT) 12.5 MG tablet; Take 1 tablet (12.5 mg total) by mouth 3 (three) times daily as needed for dizziness.  Dispense: 30 tablet; Refill: 0    Patient was given opportunity to ask questions. Patient verbalized understanding of the plan and was able to repeat key elements of the plan. All questions were answered to their satisfaction.  Minette Brine, FNP   I, Minette Brine, FNP, have reviewed all documentation for this visit. The documentation on 11/21/22 for the exam, diagnosis, procedures, and orders are all accurate and complete.   IF YOU HAVE BEEN REFERRED TO A SPECIALIST, IT MAY TAKE 1-2 WEEKS TO SCHEDULE/PROCESS THE REFERRAL. IF YOU HAVE NOT HEARD FROM US/SPECIALIST IN TWO WEEKS, PLEASE GIVE [place] A CALL AT [phone].   THE PATIENT IS ENCOURAGED TO PRACTICE SOCIAL DISTANCING DUE TO THE COVID-19 PANDEMIC.

## 2022-11-22 LAB — CMP14+EGFR
ALT: 13 IU/L (ref 0–32)
AST: 20 IU/L (ref 0–40)
Albumin/Globulin Ratio: 1.6 (ref 1.2–2.2)
Albumin: 4.3 g/dL (ref 3.9–4.9)
Alkaline Phosphatase: 48 IU/L (ref 44–121)
BUN/Creatinine Ratio: 9 (ref 9–23)
BUN: 10 mg/dL (ref 6–24)
Bilirubin Total: 0.3 mg/dL (ref 0.0–1.2)
CO2: 23 mmol/L (ref 20–29)
Calcium: 9.9 mg/dL (ref 8.7–10.2)
Chloride: 101 mmol/L (ref 96–106)
Creatinine, Ser: 1.06 mg/dL — ABNORMAL HIGH (ref 0.57–1.00)
Globulin, Total: 2.7 g/dL (ref 1.5–4.5)
Glucose: 78 mg/dL (ref 70–99)
Potassium: 4.2 mmol/L (ref 3.5–5.2)
Sodium: 139 mmol/L (ref 134–144)
Total Protein: 7 g/dL (ref 6.0–8.5)
eGFR: 68 mL/min/{1.73_m2} (ref >59)

## 2022-11-22 LAB — CBC
Hematocrit: 37.8 % (ref 34.0–46.6)
Hemoglobin: 12.8 g/dL (ref 11.1–15.9)
MCH: 30.4 pg (ref 26.6–33.0)
MCHC: 33.9 g/dL (ref 31.5–35.7)
MCV: 90 fL (ref 79–97)
Platelets: 363 10*3/uL (ref 150–450)
RBC: 4.21 x10E6/uL (ref 3.77–5.28)
RDW: 12 % (ref 11.7–15.4)
WBC: 5 10*3/uL (ref 3.4–10.8)

## 2022-11-22 LAB — TSH: TSH: 1.08 u[IU]/mL (ref 0.450–4.500)

## 2022-12-03 ENCOUNTER — Encounter: Payer: Self-pay | Admitting: Nurse Practitioner

## 2022-12-03 ENCOUNTER — Ambulatory Visit: Payer: BC Managed Care – PPO

## 2022-12-04 ENCOUNTER — Other Ambulatory Visit: Payer: Self-pay | Admitting: Nurse Practitioner

## 2022-12-04 DIAGNOSIS — R7989 Other specified abnormal findings of blood chemistry: Secondary | ICD-10-CM

## 2022-12-23 ENCOUNTER — Other Ambulatory Visit: Payer: Self-pay | Admitting: Nurse Practitioner

## 2022-12-23 ENCOUNTER — Encounter: Payer: Self-pay | Admitting: Nurse Practitioner

## 2022-12-23 DIAGNOSIS — R42 Dizziness and giddiness: Secondary | ICD-10-CM

## 2022-12-23 DIAGNOSIS — R519 Headache, unspecified: Secondary | ICD-10-CM

## 2022-12-23 MED ORDER — AMLODIPINE BESYLATE 10 MG PO TABS
10.0000 mg | ORAL_TABLET | Freq: Every day | ORAL | 11 refills | Status: DC
Start: 2022-12-23 — End: 2023-12-22

## 2022-12-30 ENCOUNTER — Other Ambulatory Visit: Payer: BLUE CROSS/BLUE SHIELD

## 2023-01-02 ENCOUNTER — Encounter: Payer: Self-pay | Admitting: Nurse Practitioner

## 2023-01-02 ENCOUNTER — Ambulatory Visit: Payer: BC Managed Care – PPO | Admitting: Nurse Practitioner

## 2023-01-02 VITALS — BP 122/88 | HR 62 | Temp 98.4°F | Ht 65.0 in | Wt 188.4 lb

## 2023-01-02 DIAGNOSIS — N6323 Unspecified lump in the left breast, lower outer quadrant: Secondary | ICD-10-CM | POA: Insufficient documentation

## 2023-01-02 DIAGNOSIS — I10 Essential (primary) hypertension: Secondary | ICD-10-CM | POA: Diagnosis not present

## 2023-01-02 DIAGNOSIS — Z0000 Encounter for general adult medical examination without abnormal findings: Secondary | ICD-10-CM | POA: Diagnosis not present

## 2023-01-02 DIAGNOSIS — R42 Dizziness and giddiness: Secondary | ICD-10-CM | POA: Insufficient documentation

## 2023-01-02 DIAGNOSIS — J309 Allergic rhinitis, unspecified: Secondary | ICD-10-CM | POA: Insufficient documentation

## 2023-01-02 DIAGNOSIS — R519 Headache, unspecified: Secondary | ICD-10-CM | POA: Insufficient documentation

## 2023-01-02 DIAGNOSIS — Z8669 Personal history of other diseases of the nervous system and sense organs: Secondary | ICD-10-CM

## 2023-01-02 DIAGNOSIS — E559 Vitamin D deficiency, unspecified: Secondary | ICD-10-CM | POA: Diagnosis not present

## 2023-01-02 DIAGNOSIS — O039 Complete or unspecified spontaneous abortion without complication: Secondary | ICD-10-CM | POA: Insufficient documentation

## 2023-01-02 DIAGNOSIS — R87619 Unspecified abnormal cytological findings in specimens from cervix uteri: Secondary | ICD-10-CM | POA: Insufficient documentation

## 2023-01-02 DIAGNOSIS — N898 Other specified noninflammatory disorders of vagina: Secondary | ICD-10-CM | POA: Insufficient documentation

## 2023-01-02 DIAGNOSIS — Z23 Encounter for immunization: Secondary | ICD-10-CM | POA: Diagnosis not present

## 2023-01-02 DIAGNOSIS — K219 Gastro-esophageal reflux disease without esophagitis: Secondary | ICD-10-CM | POA: Insufficient documentation

## 2023-01-02 MED ORDER — UBRELVY 50 MG PO TABS: 1.0000 | ORAL_TABLET | Freq: Every day | ORAL | 1 refills | Status: DC

## 2023-01-02 NOTE — Patient Instructions (Signed)
[email or web address]

## 2023-01-02 NOTE — Progress Notes (Signed)
I,Sheena H Holbrook,acting as a Education administrator for Minette Brine, FNP.,have documented all relevant documentation on the behalf of Minette Brine, FNP,as directed by  Minette Brine, FNP while in the presence of Minette Brine, [place].   Subjective:     Patient ID: [name] , female    DOB: [birth year] , 42 y.o.   MRN: [id number]   Chief Complaint  Patient presents with   Annual Exam    HPI  Patient presents today for annual exam. Continues to be followed by Dr. Velia Meyer [facility]  Patient reports ongoing dizziness, comes and goes, she has not noticed a pattern to this; does report headaches and nausea when having dizzy spells; denies blurry vision or speech problems.   Her weight had decreased to 175 lbs until the holidays up to 185 lbs - goal is 175 lbs.      Past Medical History:  Diagnosis Date   Gestational hypertension      Family History  Problem Relation Age of Onset   Hypertension Mother    Hypertension Father    Other Neg Hx      Current Outpatient Medications:    amLODipine (NORVASC) 10 MG tablet, Take 1 tablet (10 mg total) by mouth daily., Disp: 30 tablet, Rfl: 11   cetirizine (ZYRTEC) 10 MG tablet, Take 1 tablet (10 mg total) by mouth daily., Disp: 90 tablet, Rfl: 1   ipratropium (ATROVENT) 0.06 % nasal spray, Place 2 sprays into the nose 3 (three) times daily., Disp: 15 mL, Rfl: 2   omeprazole (PRILOSEC) 40 MG capsule, TAKE 1 CAPSULE BY MOUTH EVERY DAY, Disp: 90 capsule, Rfl: 1   Ubrogepant (UBRELVY) 50 MG TABS, Take 1 tablet (50 mg total) by mouth daily. May repeat in 2 hours if not better, Disp: 30 tablet, Rfl: 1   Vitamin D, Ergocalciferol, (DRISDOL) 1.25 MG (50000 UNIT) CAPS capsule, Take 1 capsule (50,000 Units total) by mouth 2 (two) times a week., Disp: 24 capsule, Rfl: 1   meclizine (ANTIVERT) 12.5 MG tablet, Take 1 tablet (12.5 mg total) by mouth 3 (three) times daily as needed for dizziness. (Patient not taking: Reported on 01/02/2023), Disp: 30 tablet, Rfl: 0    Allergies  Allergen Reactions   Diflucan [Fluconazole] Other (See Comments)    Skin on her hand burned off.      The patient states she uses IUD for birth control.  No LMP recorded. (Menstrual status: IUD).. Negative for Dysmenorrhea. Negative for: breast discharge, breast lump(s), breast pain and breast self exam. Associated symptoms include abnormal vaginal bleeding. Pertinent negatives include abnormal bleeding (hematology), anxiety, decreased libido, depression, difficulty falling sleep, dyspareunia, history of infertility, nocturia, sexual dysfunction, sleep disturbances, urinary incontinence, urinary urgency, vaginal discharge and vaginal itching. Diet regular - high protein and low carb - fruit for breakfast, salad with vegetables. The patient states her exercise level is moderate to vigorous 4-5 days a week.   The patient's tobacco use is:  Social History   Tobacco Use  Smoking Status Never  Smokeless Tobacco Never   She has been exposed to passive smoke. The patient's alcohol use is:  Social History   Substance and Sexual Activity  Alcohol Use No   Additional information: Last pap 10/17/2021, next one scheduled for 10/17/2024.    Review of Systems  Constitutional: Negative.   HENT: Negative.    Eyes: Negative.   Respiratory: Negative.    Cardiovascular: Negative.  Negative for chest pain, palpitations and leg swelling.  Gastrointestinal: Negative.  Endocrine: Negative.   Genitourinary: Negative.   Musculoskeletal: Negative.   Skin: Negative.   Allergic/Immunologic: Negative.   Neurological: Negative.   Hematological: Negative.   Psychiatric/Behavioral: Negative.       Today's Vitals   01/02/23 0916  BP: 122/88  Pulse: 62  Temp: 98.4 F (36.9 C)  TempSrc: Oral  SpO2: 98%  Weight: 188 lb 6.4 oz (85.5 kg)  Height: '5\' 5"'$  (1.651 m)   Body mass index is 31.35 kg/m.   Objective:  Physical Exam Vitals reviewed.  Constitutional:      General: She is not  in acute distress.    Appearance: Normal appearance. She is well-developed. She is obese.  HENT:     Head: Normocephalic and atraumatic.     Right Ear: Hearing, tympanic membrane, ear canal and external ear normal. There is no impacted cerumen.     Left Ear: Hearing, tympanic membrane, ear canal and external ear normal. There is no impacted cerumen.     Nose:     Comments: Deferred - masked    Mouth/Throat:     Comments: Deferred - masked Eyes:     General: Lids are normal.     Extraocular Movements: Extraocular movements intact.     Conjunctiva/sclera: Conjunctivae normal.     Pupils: Pupils are equal, round, and reactive to light.     Funduscopic exam:    Right eye: No papilledema.        Left eye: No papilledema.  Neck:     Thyroid: No thyroid mass.     Vascular: No carotid bruit.  Cardiovascular:     Rate and Rhythm: Normal rate and regular rhythm.     Pulses: Normal pulses.     Heart sounds: Normal heart sounds. No murmur heard. Pulmonary:     Effort: Pulmonary effort is normal. No respiratory distress.     Breath sounds: Normal breath sounds. No wheezing.  Chest:     Chest wall: No mass.  Breasts:    Tanner Score is 5.     Right: Normal. No mass or tenderness.     Left: Mass (lump palpated to left outer breast at 5 oclock) present. No tenderness.  Abdominal:     General: Abdomen is flat. Bowel sounds are normal. There is no distension.     Palpations: Abdomen is soft.     Tenderness: There is no abdominal tenderness.  Genitourinary:    Rectum: Guaiac result negative.  Musculoskeletal:        General: No swelling. Normal range of motion.     Cervical back: Full passive range of motion without pain, normal range of motion and neck supple.     Right lower leg: No edema.     Left lower leg: No edema.  Lymphadenopathy:     Upper Body:     Right upper body: No supraclavicular, axillary or pectoral adenopathy.     Left upper body: No supraclavicular, axillary or  pectoral adenopathy.  Skin:    General: Skin is warm and dry.     Capillary Refill: Capillary refill takes less than 2 seconds.  Neurological:     General: No focal deficit present.     Mental Status: She is alert and oriented to person, place, and time.     Cranial Nerves: No cranial nerve deficit.     Sensory: No sensory deficit.     Motor: No weakness.  Psychiatric:        Mood and Affect: Mood normal.  Behavior: Behavior normal.        Thought Content: Thought content normal.        Judgment: Judgment normal.         Assessment And Plan:     1. Encounter for general adult medical examination w/o abnormal findings Behavior modifications discussed and diet history reviewed.   Pt will continue to exercise regularly and modify diet with low GI, plant based foods and decrease intake of processed foods.  Recommend intake of daily multivitamin, Vitamin D, and calcium.  Recommend mammogram for preventive screenings, as well as recommend immunizations that include influenza, TDAP  2. Essential hypertension Comments: Blood pressure is slightly elevated. Continue focusing on low salt diet. - EKG 12-Lead  3. Vitamin D deficiency Will check vitamin D level and supplement as needed.    Also encouraged to spend 15 minutes in the sun daily.   4. Mass of lower outer quadrant of left breast Comments: Lump palpated to left outer breast at 5 oclock, will order diagnostic mammogram  5. Lightheadedness Comments: Complex migraine vs vertigo vs orthostatic hypotension. Will f/u on her CT scan brain to do Peer to [facility]. - Ubrogepant (UBRELVY) 50 MG TABS; Take 1 tablet (50 mg total) by mouth daily. May repeat in 2 hours if not better  Dispense: 30 tablet; Refill: 1  6. History of migraine Her current symptoms of the dizziness/lightheaded may be related to a complex migraine. Will try her on Ubrelvy to see if has improvement - Ubrogepant (UBRELVY) 50 MG TABS; Take 1 tablet (50 mg total) by  mouth daily. May repeat in 2 hours if not better  Dispense: 30 tablet; Refill: 1  7. Need for immunization against influenza Influenza vaccine administered Encouraged to take Tylenol as needed for fever or muscle aches. - Flu Vaccine QUAD 2moIM (Fluarix, Fluzone & Alfiuria Quad PF)   Patient was given opportunity to ask questions. Patient verbalized understanding of the plan and was able to repeat key elements of the plan. All questions were answered to their satisfaction.   JMinette Brine FNP   I, JMinette Brine FNP, have reviewed all documentation for this visit. The documentation on 01/02/23 for the exam, diagnosis, procedures, and orders are all accurate and complete.   THE PATIENT IS ENCOURAGED TO PRACTICE SOCIAL DISTANCING DUE TO THE COVID-19 PANDEMIC.

## 2023-01-09 ENCOUNTER — Other Ambulatory Visit: Payer: Self-pay | Admitting: Nurse Practitioner

## 2023-01-09 DIAGNOSIS — N6323 Unspecified lump in the left breast, lower outer quadrant: Secondary | ICD-10-CM

## 2023-01-09 DIAGNOSIS — R42 Dizziness and giddiness: Secondary | ICD-10-CM

## 2023-01-09 DIAGNOSIS — I10 Essential (primary) hypertension: Secondary | ICD-10-CM

## 2023-01-09 DIAGNOSIS — E559 Vitamin D deficiency, unspecified: Secondary | ICD-10-CM

## 2023-01-09 DIAGNOSIS — Z23 Encounter for immunization: Secondary | ICD-10-CM

## 2023-01-09 DIAGNOSIS — Z0000 Encounter for general adult medical examination without abnormal findings: Secondary | ICD-10-CM

## 2023-01-09 DIAGNOSIS — Z8669 Personal history of other diseases of the nervous system and sense organs: Secondary | ICD-10-CM

## 2023-01-16 ENCOUNTER — Ambulatory Visit: Payer: BC Managed Care – PPO | Admitting: Neurology

## 2023-01-16 ENCOUNTER — Encounter: Payer: Self-pay | Admitting: Neurology

## 2023-01-16 VITALS — Ht 65.0 in | Wt 191.8 lb

## 2023-01-16 DIAGNOSIS — R0683 Snoring: Secondary | ICD-10-CM | POA: Diagnosis not present

## 2023-01-16 DIAGNOSIS — G43009 Migraine without aura, not intractable, without status migrainosus: Secondary | ICD-10-CM | POA: Diagnosis not present

## 2023-01-16 DIAGNOSIS — R42 Dizziness and giddiness: Secondary | ICD-10-CM | POA: Diagnosis not present

## 2023-01-16 DIAGNOSIS — R519 Headache, unspecified: Secondary | ICD-10-CM

## 2023-01-16 DIAGNOSIS — R351 Nocturia: Secondary | ICD-10-CM

## 2023-01-16 DIAGNOSIS — Z9189 Other specified personal risk factors, not elsewhere classified: Secondary | ICD-10-CM

## 2023-01-16 NOTE — Patient Instructions (Signed)
It was nice to meet you today.  I do believe you have intermittent migraines, dizziness can be a nonspecific accompaniment of migraines. Please continue to stay well-hydrated but limit yourself to 3-4 bottles of water per day, 16.9 ounce size each.  Avoid caffeine as you are. Exercise in moderation.  Try to get enough rest, 7 to 8 hours of sleep are recommended.  We will look into sleep apnea possibility with a sleep test at home.  If you have sleep apnea I will likely recommend treatment with an AutoPap machine.  Your neurological exam is normal, nevertheless, we will look into doing a brain MRI with and without contrast.

## 2023-01-16 NOTE — Progress Notes (Signed)
Subjective:    Patient ID: [name] is a 42 y.o. female.  HPI    Star Age, MD, PhD [facility] [address], [address] P.O. [place],  [address]   Dear Doreene Burke,  I saw your patient, [name], upon your kind request in my sleep neurologic clinic today for initial consultation of her dizziness and recurrent headaches.  The patient is unaccompanied today.  As you know, [name] is is a 42 year old female with an underlying medical history of vitamin D deficiency, hypertension, allergies, reflux disease, migraine headaches, and mild obesity, who reports intermittent headaches and lightheadedness.  [name] has a history of migraines.  [name] started having intermittent lightheadedness in January 2024 and symptoms are often brief, lasting minutes, no telltale vertigo spells, no falls.  [name] denies any sudden onset of one-sided weakness or numbness or tingling or droopy face or slurring of speech but has had intermittent numbness coming down her left arm.  [name] feels lightheaded, like [name] is going to faint.  [name] has not had any syncopal spells.  I reviewed your office visit note from 01/02/2023.  [name] had blood work through your office, TSH was normal on 11/21/2022.  [name] has a history of vitamin D deficiency and has a prescription for vitamin D.you ordered a head CT scan but [name] has not had it done.  [name] had a prior brain MRI without contrast on 12/03/2013 for indication of headaches and difficulty talking, dizziness with nausea and I reviewed the results: Impression: Negative noncontrasted MRI of the brain.  [name] reports that [name] drinks about 6-8 bottles of water per day, 16 ounce size each. [name] often exercises first thing in the morning.  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 [name] was started on Ubrelvy in February 2024, but has not tried it yet.  [name] reports that [name] had about 4 migraines last month.  [name] snores, [name] does not always sleep well.   [name] has sleep disruption and difficulty maintaining sleep.  Bedtime is generally between 10 and 11 PM and rise time between 4:30 AM and 5:30 AM.  [name] has had nausea but no vomiting.  [name] had an eye exam last year.  [name] has prescription eyeglasses and has had occasional blurry vision, particularly in the right eye.  [name] denies any hearing loss or tinnitus.  Sometimes [name] feels [name] has spinning sensation but has not seen ENT yet.  [name] has never had a sleep study.  [name] occasionally wakes up with a headache.  [name] has nocturia about 2-5 times per night on average.   Her Past Medical History Is Significant For: Past Medical History:  Diagnosis Date   Gestational hypertension     Her Past Surgical History Is Significant For: Past Surgical History:  Procedure Laterality Date   CESAREAN SECTION     INGUINAL HERNIA REPAIR      Her Family History Is Significant For: Family History  Problem Relation Age of Onset   Hypertension Mother    Hypertension Father    Other Neg Hx    Migraines Neg Hx    Headache Neg Hx     Her Social History Is Significant For: Social History   Socioeconomic History   Marital status: Married    Spouse name: Not on file   Number of children: Not on file   Years of education: Not on file   Highest education level: Not on file  Occupational History   Not on file  Tobacco Use   Smoking status: Never   Smokeless tobacco: Never  Substance and Sexual Activity   Alcohol use: Yes    Comment: social   Drug use: No   Sexual activity: Yes    Birth control/protection: I.U.D.  Other Topics Concern   Not on file  Social History Narrative   Not on file   Social Determinants of Health   Financial Resource Strain: Not on file  Food Insecurity: Not on file  Transportation Needs: Not on file  Physical Activity: Not on file  Stress: Not on file  Social Connections: Not on file    Her Allergies Are:  Allergies  Allergen Reactions   Diflucan [Fluconazole] Other  (See Comments)    Skin on her hand burned off.  :   Her Current Medications Are:  Outpatient Encounter Medications as of 01/16/2023  Medication Sig   amLODipine (NORVASC) 10 MG tablet Take 1 tablet (10 mg total) by mouth daily.   cetirizine (ZYRTEC) 10 MG tablet Take 1 tablet (10 mg total) by mouth daily.   ipratropium (ATROVENT) 0.06 % nasal spray Place 2 sprays into the nose 3 (three) times daily.   omeprazole (PRILOSEC) 40 MG capsule TAKE 1 CAPSULE BY MOUTH EVERY DAY   Vitamin D, Ergocalciferol, (DRISDOL) 1.25 MG (50000 UNIT) CAPS capsule Take 1 capsule (50,000 Units total) by mouth 2 (two) times a week.   meclizine (ANTIVERT) 12.5 MG tablet Take 1 tablet (12.5 mg total) by mouth 3 (three) times daily as needed for dizziness. (Patient not taking: Reported on 01/02/2023)   Ubrogepant (UBRELVY) 50 MG TABS Take 1 tablet (50 mg total) by mouth daily. May repeat in 2 hours if not better (Patient not taking: Reported on 01/16/2023)   No facility-administered encounter medications on file as of 01/16/2023.  :   Review of Systems:  Out of a complete 14 point review of systems, all are reviewed and negative with the exception of these symptoms as listed below:  Review of Systems  Neurological:        Pt here for Migraines and dizziness Pt states 4 migraines in last month Pt states headache for today's visit Pt pain level 3      Objective:  Neurological Exam  Physical Exam Physical Examination:   On orthostatic test seen, [name] has no orthostatic blood pressure drop.  General Examination: The patient is a very pleasant 42 y.o. female in no acute distress. [name] appears well-developed and well-nourished and well groomed.   HEENT: Normocephalic, atraumatic, pupils are equal, round and reactive to light, extraocular tracking is good without limitation to gaze excursion or nystagmus noted.  No photophobia, funduscopic exam normal.  Hearing is grossly intact. Face is symmetric with normal facial  animation. Speech is clear with no dysarthria noted. There is no hypophonia. There is no lip, neck/head, jaw or voice tremor. Neck is supple with full range of passive and active motion. There are no carotid bruits on auscultation. Oropharynx exam reveals: No significant mouth dryness, good dental hygiene, moderate airway crowding secondary to redundant soft palate and wider uvula, tonsillar size of about 2+ on the right and 1+ on the left, Mallampati class III.  Neck circumference of 14-7/8 inches.  Minimal overbite.  Tongue protrudes centrally and palate elevates symmetrically.   Chest: Clear to auscultation without wheezing, rhonchi or crackles noted.  Heart: S1+S2+0, regular and normal without murmurs, rubs or gallops noted.   Abdomen: Soft, non-tender and non-distended.  Extremities: There is no pitting  edema in the distal lower extremities bilaterally.   Skin: Warm and dry without trophic changes noted.   Musculoskeletal: exam reveals no obvious joint deformities.   Neurologically:  Mental status: The patient is awake, alert and oriented in all 4 spheres. Her immediate and remote memory, attention, language skills and fund of knowledge are appropriate. There is no evidence of aphasia, agnosia, apraxia or anomia. Speech is clear with normal prosody and enunciation. Thought process is linear. Mood is normal and affect is normal.  Cranial nerves II - XII are as described above under HEENT exam.  Motor exam: Normal bulk, strength and tone is noted. There is no obvious action or resting tremor.  No drift or rebound. Fine motor skills and coordination: Normal finger to taps, hand movements and rapid alternating patting in both upper extremities, normal foot taps bilaterally.    Cerebellar testing: No dysmetria or intention tremor. There is no truncal or gait ataxia.  Normal finger-to-nose, normal heel-to-shin bilaterally.  Reflexes 2+ throughout, toes are downgoing bilaterally. Sensory exam:  intact to light touch in the upper and lower extremities.  Gait, station and balance: [name] stands easily. No veering to one side is noted. No leaning to one side is noted. Posture is age-appropriate and stance is narrow based. Gait shows normal stride length and normal pace. No problems turning are noted.  Romberg is negative, tandem walk unremarkable.  Assessment and Plan:  In summary, [name] is a very pleasant 42 y.o.-year old female with an underlying medical history of vitamin D deficiency, hypertension, allergies, reflux disease, migraine headaches, and mild obesity, who presents for evaluation of her intermittent dizziness and headaches, [name] likely has migraine headaches without aura, intermittent without status, not intractable.  [name] has not yet tried Roselyn Meier, explained to her that this is probably a good choice for her.  [name] is recommended to have an updated eye exam as [name] has had intermittent blurry vision, neurological exam and eye exam from my end of things are normal today and [name] is largely reassured.  [name] is reminded to stay well-hydrated but avoid overhydration.  [name] is advised to proceed with a brain MRI with and without contrast to rule out a structural cause of her symptoms.  History is not suggestive of significant vertigo.  Nonspecific dizziness can be an accompaniment of migraines.  [name] is advised to proceed with a sleep study.  We talked about the different ways of testing sleep.  [name] would prefer a home sleep test as [name] is worried that [name] will not sleep enough in the sleep lab.  [name] is advised that [name] may be at higher risk for obstructive sleep apnea.  If [name] has obstructive sleep apnea, [name] is advised that I will likely recommend AutoPap therapy to her.  For now, [name] is advised to continue maintaining a healthy lifestyle, try to make enough time for sleep, try Roselyn Meier as needed for her migraines as prescribed by your office and we will proceed with brain MRI and home sleep  testing.  We will plan a follow-up afterwards.  We will keep her posted as to her results by phone call as well.  I answered all her questions today and [name] was in agreement.  [name] is reminded to make an appointment with ophthalmology for a checkup and to consider seeing ENT, as [name] does report intermittent vertigo type spells with spinning sensation and nausea.  [name] is encouraged to talk to you about a referral.   Thank you  very much for allowing me to participate in the care of this nice patient. If I can be of any further assistance to you please do not hesitate to call me at [phone].  Sincerely,   Star Age, MD, PhD

## 2023-03-18 ENCOUNTER — Other Ambulatory Visit: Payer: Self-pay | Admitting: Nurse Practitioner

## 2023-03-18 DIAGNOSIS — I10 Essential (primary) hypertension: Secondary | ICD-10-CM

## 2023-03-19 ENCOUNTER — Other Ambulatory Visit: Payer: Self-pay

## 2023-03-19 MED ORDER — OMEPRAZOLE 40 MG PO CPDR: 40.0000 mg | DELAYED_RELEASE_CAPSULE | Freq: Every day | ORAL | 1 refills | Status: DC

## 2023-04-09 ENCOUNTER — Telehealth: Payer: BC Managed Care – PPO | Admitting: Physician Assistant

## 2023-04-09 DIAGNOSIS — B9789 Other viral agents as the cause of diseases classified elsewhere: Secondary | ICD-10-CM

## 2023-04-09 DIAGNOSIS — J019 Acute sinusitis, unspecified: Secondary | ICD-10-CM

## 2023-04-09 MED ORDER — PREDNISONE 10 MG (21) PO TBPK: ORAL_TABLET | ORAL | 0 refills | Status: DC

## 2023-04-09 MED ORDER — FLUTICASONE PROPIONATE 50 MCG/ACT NA SUSP: 2.0000 | Freq: Every day | NASAL | 0 refills | Status: AC

## 2023-04-09 NOTE — Progress Notes (Signed)
I have spent 5 minutes in review of e-visit questionnaire, review and updating patient chart, medical decision making and response to patient.   Jissel Slavens Cody Lesia Monica, PA-C    

## 2023-04-09 NOTE — Progress Notes (Signed)
E-Visit for Sinus Problems  We are sorry that you are not feeling well.  Here is how we plan to help!  Based on what you have shared with me it looks like you have sinusitis.  Sinusitis is inflammation and infection in the sinus cavities of the head.  Based on your presentation I believe you most likely have Acute Viral Sinusitis.This is an infection most likely caused by a virus. There is not specific treatment for viral sinusitis other than to help you with the symptoms until the infection runs its course.  You may use an oral decongestant such as Mucinex D or if you have glaucoma or high blood pressure use plain Mucinex. Saline nasal spray help and can safely be used as often as needed for congestion, I have prescribed: Fluticasone nasal spray two sprays in each nostril once a day. I have also sent in a steroid pack to take as directed.   Some authorities believe that zinc sprays or the use of Echinacea may shorten the course of your symptoms.  Sinus infections are not as easily transmitted as other respiratory infection, however we still recommend that you avoid close contact with loved ones, especially the very young and elderly.  Remember to wash your hands thoroughly throughout the day as this is the number one way to prevent the spread of infection!  Home Care: Only take medications as instructed by your medical team. Do not take these medications with alcohol. A steam or ultrasonic humidifier can help congestion.  You can place a towel over your head and breathe in the steam from hot water coming from a faucet. Avoid close contacts especially the very young and the elderly. Cover your mouth when you cough or sneeze. Always remember to wash your hands.  Get Help Right Away If: You develop worsening fever or sinus pain. You develop a severe head ache or visual changes. Your symptoms persist after you have completed your treatment plan.  Make sure you Understand these  instructions. Will watch your condition. Will get help right away if you are not doing well or get worse.   Thank you for choosing an e-visit.  Your e-visit answers were reviewed by a board certified advanced clinical practitioner to complete your personal care plan. Depending upon the condition, your plan could have included both over the counter or prescription medications.  Please review your pharmacy choice. Make sure the pharmacy is open so you can pick up prescription now. If there is a problem, you may contact your provider through [employer] and have the prescription routed to another pharmacy.  Your safety is important to [place]. If you have drug allergies check your prescription carefully.   For the next 24 hours you can use MyChart to ask questions about today's visit, request a non-urgent call back, or ask for a work or school excuse. You will get an email in the next two days asking about your experience. I hope that your e-visit has been valuable and will speed your recovery.

## 2023-05-06 ENCOUNTER — Ambulatory Visit: Payer: BC Managed Care – PPO | Admitting: Nurse Practitioner

## 2023-05-26 ENCOUNTER — Telehealth: Payer: BC Managed Care – PPO | Admitting: Family Medicine

## 2023-05-26 DIAGNOSIS — J069 Acute upper respiratory infection, unspecified: Secondary | ICD-10-CM

## 2023-05-26 MED ORDER — AZELASTINE HCL 0.1 % NA SOLN: 2.0000 | Freq: Two times a day (BID) | NASAL | 12 refills | Status: AC

## 2023-05-26 MED ORDER — PSEUDOEPH-BROMPHEN-DM 30-2-10 MG/5ML PO SYRP: 5.0000 mL | ORAL_SOLUTION | Freq: Four times a day (QID) | ORAL | 0 refills | Status: DC | PRN

## 2023-05-26 NOTE — Progress Notes (Signed)

## 2023-09-12 ENCOUNTER — Other Ambulatory Visit: Payer: Self-pay | Admitting: Nurse Practitioner

## 2023-10-19 ENCOUNTER — Encounter: Payer: Self-pay | Admitting: Nurse Practitioner

## 2023-10-21 ENCOUNTER — Ambulatory Visit: Payer: Self-pay | Admitting: Nurse Practitioner

## 2023-10-21 ENCOUNTER — Encounter: Payer: Self-pay | Admitting: Nurse Practitioner

## 2023-10-21 VITALS — BP 110/80 | HR 60 | Temp 97.8°F | Ht 65.0 in | Wt 204.6 lb

## 2023-10-21 DIAGNOSIS — R051 Acute cough: Secondary | ICD-10-CM | POA: Diagnosis not present

## 2023-10-21 DIAGNOSIS — I10 Essential (primary) hypertension: Secondary | ICD-10-CM | POA: Diagnosis not present

## 2023-10-21 DIAGNOSIS — R0981 Nasal congestion: Secondary | ICD-10-CM

## 2023-10-21 DIAGNOSIS — E66811 Obesity, class 1: Secondary | ICD-10-CM

## 2023-10-21 DIAGNOSIS — Z2821 Immunization not carried out because of patient refusal: Secondary | ICD-10-CM

## 2023-10-21 DIAGNOSIS — R0989 Other specified symptoms and signs involving the circulatory and respiratory systems: Secondary | ICD-10-CM | POA: Diagnosis not present

## 2023-10-21 DIAGNOSIS — E6609 Other obesity due to excess calories: Secondary | ICD-10-CM

## 2023-10-21 DIAGNOSIS — Z6834 Body mass index (BMI) 34.0-34.9, adult: Secondary | ICD-10-CM

## 2023-10-21 MED ORDER — ALBUTEROL SULFATE HFA 108 (90 BASE) MCG/ACT IN AERS: 2.0000 | INHALATION_SPRAY | Freq: Four times a day (QID) | RESPIRATORY_TRACT | 2 refills | Status: AC | PRN

## 2023-10-21 MED ORDER — AZITHROMYCIN 250 MG PO TABS: ORAL_TABLET | ORAL | 0 refills | Status: AC

## 2023-10-21 NOTE — Progress Notes (Signed)
Madelaine Bhat, CMA,acting as a Neurosurgeon for Arnette Felts, FNP.,have documented all relevant documentation on the behalf of Arnette Felts, FNP,as directed by  Arnette Felts, FNP while in the presence of Arnette Felts, FNP.  Subjective:  Patient ID: [name] , female    DOB: [birth year] , 42 y.o.   MRN: [id number]  Chief Complaint  Patient presents with   URI    HPI  Patient presents today for sore throat, cough, sneezing, nasal and chest congestion for about a week. Patient reports her chest is sore from coughing. She has been taking sudafed and nyquil. Her sore throat went away. She has also used the Afrin spray. She takes Zyrtec daily. Patient reports her moms husband was sick during thanksgiving, she reports she also has a little SOB.  Hypertension This is a new problem. The current episode started 1 to 4 weeks ago. The problem has been gradually improving since onset. The problem is controlled. Associated symptoms include shortness of breath. Pertinent negatives include no anxiety, chest pain, headaches or palpitations. Agents associated with hypertension include NSAIDs. Risk factors for coronary artery disease include sedentary lifestyle and obesity. Past treatments include diuretics and calcium channel blockers. Compliance problems include exercise (last 1.5 months has not been going to exercise).  There is no history of angina.     Past Medical History:  Diagnosis Date   Gestational hypertension      Family History  Problem Relation Age of Onset   Hypertension Mother    Hypertension Father    Other Neg Hx    Migraines Neg Hx    Headache Neg Hx      Current Outpatient Medications:    albuterol (VENTOLIN HFA) 108 (90 Base) MCG/ACT inhaler, Inhale 2 puffs into the lungs every 6 (six) hours as needed for wheezing or shortness of breath., Disp: 8 g, Rfl: 2   amLODipine (NORVASC) 10 MG tablet, Take 1 tablet (10 mg total) by mouth daily., Disp: 30 tablet, Rfl: 11   azelastine  (ASTELIN) 0.1 % nasal spray, Place 2 sprays into both nostrils 2 (two) times daily. Use in each nostril as directed, Disp: 30 mL, Rfl: 12   brompheniramine-pseudoephedrine-DM 30-2-10 MG/5ML syrup, Take 5 mLs by mouth 4 (four) times daily as needed., Disp: 120 mL, Rfl: 0   cetirizine (ZYRTEC) 10 MG tablet, Take 1 tablet (10 mg total) by mouth daily., Disp: 90 tablet, Rfl: 1   fluticasone (FLONASE) 50 MCG/ACT nasal spray, Place 2 sprays into both nostrils daily., Disp: 16 g, Rfl: 0   omeprazole (PRILOSEC) 40 MG capsule, TAKE 1 CAPSULE (40 MG TOTAL) BY MOUTH DAILY., Disp: 90 capsule, Rfl: 1   Vitamin D, Ergocalciferol, (DRISDOL) 1.25 MG (50000 UNIT) CAPS capsule, Take 1 capsule (50,000 Units total) by mouth 2 (two) times a week., Disp: 24 capsule, Rfl: 1   Ubrogepant (UBRELVY) 50 MG TABS, Take 1 tablet (50 mg total) by mouth daily. May repeat in 2 hours if not better (Patient not taking: Reported on 01/16/2023), Disp: 30 tablet, Rfl: 1   Allergies  Allergen Reactions   Diflucan [Fluconazole] Other (See Comments)    Skin on her hand burned off.     Review of Systems  HENT:  Positive for congestion, postnasal drip, sneezing and sore throat.   Respiratory:  Positive for cough and shortness of breath.   Cardiovascular:  Negative for chest pain, palpitations and leg swelling.  Neurological:  Negative for headaches.  Psychiatric/Behavioral: Negative.  Today's Vitals   10/21/23 1053  BP: 110/80  Pulse: 60  Temp: 97.8 F (36.6 C)  TempSrc: Oral  SpO2: 98%  Weight: 204 lb 9.6 oz (92.8 kg)  Height: 5\' 5"  (1.651 m)  PainSc: 0-No pain   Body mass index is 34.05 kg/m.  Wt Readings from Last 3 Encounters:  10/21/23 204 lb 9.6 oz (92.8 kg)  01/16/23 191 lb 12.8 oz (87 kg)  01/02/23 188 lb 6.4 oz (85.5 kg)     Objective:  Physical Exam Vitals reviewed.  Constitutional:      General: She is not in acute distress.    Appearance: Normal appearance.  Cardiovascular:     Pulses: Normal  pulses.     Heart sounds: Normal heart sounds.  Pulmonary:     Effort: Pulmonary effort is normal. No respiratory distress.     Breath sounds: Normal breath sounds. No wheezing.  Skin:    General: Skin is warm and dry.     Capillary Refill: Capillary refill takes less than 2 seconds.  Neurological:     General: No focal deficit present.     Mental Status: She is alert and oriented to person, place, and time.     Cranial Nerves: No cranial nerve deficit.     Sensory: Sensation is intact.     Motor: No weakness.     Coordination: Romberg sign positive (slight positive). Coordination normal. Finger-Nose-Finger Test normal.     Gait: Gait is intact.         Assessment And Plan:  Nasal congestion Assessment & Plan: Has been more than 10 day history, will treat with azithromycin and advised to use flonase.    Chest congestion  Acute cough -     Albuterol Sulfate HFA; Inhale 2 puffs into the lungs every 6 (six) hours as needed for wheezing or shortness of breath.  Dispense: 8 g; Refill: 2 -     Azithromycin; Take 2 tablets (500 mg) on  Day 1,  followed by 1 tablet (250 mg) once daily on Days 2 through 5.  Dispense: 6 each; Refill: 0  Essential hypertension Assessment & Plan: Blood pressure is controlled, continue current medications  Orders: -     BMP8+eGFR  Influenza vaccination declined Assessment & Plan: Currently has cold symptoms.    Tetanus, diphtheria, and acellular pertussis (Tdap) vaccination declined  Class 1 obesity due to excess calories with serious comorbidity and body mass index (BMI) of 34.0 to 34.9 in adult Assessment & Plan: She is encouraged to strive for BMI less than 30 to decrease cardiac risk. Advised to aim for at least 150 minutes of exercise per week.      Return for keep same next.  Patient was given opportunity to ask questions. Patient verbalized understanding of the plan and was able to repeat key elements of the plan. All questions were  answered to their satisfaction.    Jeanell Sparrow, FNP, have reviewed all documentation for this visit. The documentation on 10/21/23 for the exam, diagnosis, procedures, and orders are all accurate and complete.   IF YOU HAVE BEEN REFERRED TO A SPECIALIST, IT MAY TAKE 1-2 WEEKS TO SCHEDULE/PROCESS THE REFERRAL. IF YOU HAVE NOT HEARD FROM US/SPECIALIST IN TWO WEEKS, PLEASE GIVE [place] A CALL AT [phone].

## 2023-10-22 LAB — BMP8+EGFR
BUN/Creatinine Ratio: 12 (ref 9–23)
BUN: 12 mg/dL (ref 6–24)
CO2: 24 mmol/L (ref 20–29)
Calcium: 9.9 mg/dL (ref 8.7–10.2)
Chloride: 102 mmol/L (ref 96–106)
Creatinine, Ser: 1 mg/dL (ref 0.57–1.00)
Glucose: 79 mg/dL (ref 70–99)
Potassium: 4 mmol/L (ref 3.5–5.2)
Sodium: 140 mmol/L (ref 134–144)
eGFR: 73 mL/min/{1.73_m2} (ref >59)

## 2023-11-06 DIAGNOSIS — R0981 Nasal congestion: Secondary | ICD-10-CM | POA: Insufficient documentation

## 2023-11-06 DIAGNOSIS — E66811 Obesity, class 1: Secondary | ICD-10-CM | POA: Insufficient documentation

## 2023-11-06 DIAGNOSIS — R0989 Other specified symptoms and signs involving the circulatory and respiratory systems: Secondary | ICD-10-CM | POA: Insufficient documentation

## 2023-11-06 DIAGNOSIS — R051 Acute cough: Secondary | ICD-10-CM | POA: Insufficient documentation

## 2023-11-06 DIAGNOSIS — E6609 Other obesity due to excess calories: Secondary | ICD-10-CM | POA: Insufficient documentation

## 2023-11-06 DIAGNOSIS — Z2821 Immunization not carried out because of patient refusal: Secondary | ICD-10-CM | POA: Insufficient documentation

## 2023-11-06 NOTE — Assessment & Plan Note (Signed)
Currently has cold symptoms.

## 2023-11-06 NOTE — Assessment & Plan Note (Signed)
 She is encouraged to strive for BMI less than 30 to decrease cardiac risk. Advised to aim for at least 150 minutes of exercise per week.

## 2023-11-06 NOTE — Assessment & Plan Note (Signed)
Has been more than 10 day history, will treat with azithromycin and advised to use flonase.

## 2023-11-06 NOTE — Assessment & Plan Note (Signed)
Blood pressure is controlled, continue current medications

## 2023-12-21 ENCOUNTER — Other Ambulatory Visit: Payer: Self-pay | Admitting: Nurse Practitioner

## 2024-01-05 ENCOUNTER — Encounter: Payer: Self-pay | Admitting: Nurse Practitioner

## 2024-01-06 ENCOUNTER — Ambulatory Visit: Payer: 59 | Admitting: Nurse Practitioner

## 2024-01-06 ENCOUNTER — Encounter: Payer: Self-pay | Admitting: Nurse Practitioner

## 2024-01-17 ENCOUNTER — Encounter: Payer: Self-pay | Admitting: Nurse Practitioner

## 2024-01-21 ENCOUNTER — Ambulatory Visit: Payer: Self-pay | Admitting: Nurse Practitioner

## 2024-01-21 NOTE — Progress Notes (Deleted)
 Madelaine Bhat, CMA,acting as a Neurosurgeon for Arnette Felts, FNP.,have documented all relevant documentation on the behalf of Arnette Felts, FNP,as directed by  Arnette Felts, FNP while in the presence of Arnette Felts, FNP.  Subjective:  Patient ID: [name] , female    DOB: [birth year] , 43 y.o.   MRN: [id number]  No chief complaint on file.   HPI  Patient presents today for a rash    Past Medical History:  Diagnosis Date  . Gestational hypertension      Family History  Problem Relation Age of Onset  . Hypertension Mother   . Hypertension Father   . Other Neg Hx   . Migraines Neg Hx   . Headache Neg Hx      Current Outpatient Medications:  .  albuterol (VENTOLIN HFA) 108 (90 Base) MCG/ACT inhaler, Inhale 2 puffs into the lungs every 6 (six) hours as needed for wheezing or shortness of breath., Disp: 8 g, Rfl: 2 .  amLODipine (NORVASC) 10 MG tablet, TAKE 1 TABLET BY MOUTH EVERY DAY, Disp: 90 tablet, Rfl: 3 .  azelastine (ASTELIN) 0.1 % nasal spray, Place 2 sprays into both nostrils 2 (two) times daily. Use in each nostril as directed, Disp: 30 mL, Rfl: 12 .  brompheniramine-pseudoephedrine-DM 30-2-10 MG/5ML syrup, Take 5 mLs by mouth 4 (four) times daily as needed., Disp: 120 mL, Rfl: 0 .  cetirizine (ZYRTEC) 10 MG tablet, Take 1 tablet (10 mg total) by mouth daily., Disp: 90 tablet, Rfl: 1 .  fluticasone (FLONASE) 50 MCG/ACT nasal spray, Place 2 sprays into both nostrils daily., Disp: 16 g, Rfl: 0 .  omeprazole (PRILOSEC) 40 MG capsule, TAKE 1 CAPSULE (40 MG TOTAL) BY MOUTH DAILY., Disp: 90 capsule, Rfl: 1 .  Ubrogepant (UBRELVY) 50 MG TABS, Take 1 tablet (50 mg total) by mouth daily. May repeat in 2 hours if not better (Patient not taking: Reported on 01/16/2023), Disp: 30 tablet, Rfl: 1 .  Vitamin D, Ergocalciferol, (DRISDOL) 1.25 MG (50000 UNIT) CAPS capsule, Take 1 capsule (50,000 Units total) by mouth 2 (two) times a week., Disp: 24 capsule, Rfl: 1   Allergies  Allergen  Reactions  . Diflucan [Fluconazole] Other (See Comments)    Skin on her hand burned off.     Review of Systems   There were no vitals filed for this visit. There is no height or weight on file to calculate BMI.  Wt Readings from Last 3 Encounters:  10/21/23 204 lb 9.6 oz (92.8 kg)  01/16/23 191 lb 12.8 oz (87 kg)  01/02/23 188 lb 6.4 oz (85.5 kg)    The 10-year ASCVD risk score (Arnett DK, et al., 2019) is: 0.6%   Values used to calculate the score:     Age: 54 years     Sex: Female     Is Non-Hispanic African American: Yes     Diabetic: No     Tobacco smoker: No     Systolic Blood Pressure: 110 mmHg     Is BP treated: Yes     HDL Cholesterol: 55 mg/dL     Total Cholesterol: 154 mg/dL  Objective:  Physical Exam      Assessment And Plan:  There are no diagnoses linked to this encounter.  No follow-ups on file.  Patient was given opportunity to ask questions. Patient verbalized understanding of the plan and was able to repeat key elements of the plan. All questions were answered to their satisfaction.  Jeanell Sparrow, FNP, have reviewed all documentation for this visit. The documentation on 01/21/24 for the exam, diagnosis, procedures, and orders are all accurate and complete.   IF YOU HAVE BEEN REFERRED TO A SPECIALIST, IT MAY TAKE 1-2 WEEKS TO SCHEDULE/PROCESS THE REFERRAL. IF YOU HAVE NOT HEARD FROM US/SPECIALIST IN TWO WEEKS, PLEASE GIVE [place] A CALL AT [phone].

## 2024-02-17 ENCOUNTER — Encounter: Payer: Self-pay | Admitting: Nurse Practitioner

## 2024-02-17 ENCOUNTER — Ambulatory Visit: Payer: Self-pay | Admitting: Nurse Practitioner

## 2024-02-17 VITALS — BP 118/80 | HR 95 | Temp 98.4°F | Ht 65.0 in | Wt 202.8 lb

## 2024-02-17 DIAGNOSIS — E559 Vitamin D deficiency, unspecified: Secondary | ICD-10-CM | POA: Diagnosis not present

## 2024-02-17 DIAGNOSIS — I10 Essential (primary) hypertension: Secondary | ICD-10-CM

## 2024-02-17 DIAGNOSIS — Z79899 Other long term (current) drug therapy: Secondary | ICD-10-CM

## 2024-02-17 DIAGNOSIS — R0789 Other chest pain: Secondary | ICD-10-CM | POA: Diagnosis not present

## 2024-02-17 DIAGNOSIS — Z0000 Encounter for general adult medical examination without abnormal findings: Secondary | ICD-10-CM | POA: Diagnosis not present

## 2024-02-17 DIAGNOSIS — L918 Other hypertrophic disorders of the skin: Secondary | ICD-10-CM

## 2024-02-17 DIAGNOSIS — Z6833 Body mass index (BMI) 33.0-33.9, adult: Secondary | ICD-10-CM

## 2024-02-17 DIAGNOSIS — E66811 Obesity, class 1: Secondary | ICD-10-CM | POA: Diagnosis not present

## 2024-02-17 LAB — POCT URINALYSIS DIP (CLINITEK)
Bilirubin, UA: NEGATIVE
Glucose, UA: NEGATIVE mg/dL
Ketones, POC UA: NEGATIVE mg/dL
Leukocytes, UA: NEGATIVE
Nitrite, UA: NEGATIVE
POC PROTEIN,UA: NEGATIVE
Spec Grav, UA: 1.015 (ref 1.010–1.025)
Urobilinogen, UA: 0.2 U/dL
pH, UA: 6 (ref 5.0–8.0)

## 2024-02-17 NOTE — Progress Notes (Signed)
 [place], CMA,acting as a Neurosurgeon for Susanna Epley, FNP.,have documented all relevant documentation on the behalf of Susanna Epley, FNP,as directed by  Susanna Epley, FNP while in the presence of Susanna Epley, FNP.  Subjective:    Patient ID: [name] , female    DOB: [birth year] , 43 y.o.   MRN: [id number]  Chief Complaint  Patient presents with   Annual Exam    HPI  Patient presents today for HM, Patient reports compliance with medication. Patient denies any chest pain, SOB, or headaches. Patient reports having skin tags under her arms she would like looked at.       Past Medical History:  Diagnosis Date   Gestational hypertension      Family History  Problem Relation Age of Onset   Hypertension Mother    Hypertension Father    Other Neg Hx    Migraines Neg Hx    Headache Neg Hx      Current Outpatient Medications:    albuterol (VENTOLIN HFA) 108 (90 Base) MCG/ACT inhaler, Inhale 2 puffs into the lungs every 6 (six) hours as needed for wheezing or shortness of breath., Disp: 8 g, Rfl: 2   amLODipine (NORVASC) 10 MG tablet, TAKE 1 TABLET BY MOUTH EVERY DAY, Disp: 90 tablet, Rfl: 3   azelastine (ASTELIN) 0.1 % nasal spray, Place 2 sprays into both nostrils 2 (two) times daily. Use in each nostril as directed, Disp: 30 mL, Rfl: 12   cetirizine (ZYRTEC) 10 MG tablet, Take 1 tablet (10 mg total) by mouth daily., Disp: 90 tablet, Rfl: 1   fluticasone (FLONASE) 50 MCG/ACT nasal spray, Place 2 sprays into both nostrils daily., Disp: 16 g, Rfl: 0   omeprazole (PRILOSEC) 40 MG capsule, TAKE 1 CAPSULE (40 MG TOTAL) BY MOUTH DAILY., Disp: 90 capsule, Rfl: 1   Vitamin D, Ergocalciferol, (DRISDOL) 1.25 MG (50000 UNIT) CAPS capsule, Take 1 capsule (50,000 Units total) by mouth 2 (two) times a week., Disp: 24 capsule, Rfl: 1   Ubrogepant (UBRELVY) 50 MG TABS, Take 1 tablet (50 mg total) by mouth daily. May repeat in 2 hours if not better (Patient not taking: Reported on  02/17/2024), Disp: 30 tablet, Rfl: 1   Allergies  Allergen Reactions   Diflucan [Fluconazole] Other (See Comments)    Skin on her hand burned off.      The patient states she uses IUD for birth control. No LMP recorded (lmp unknown). (Menstrual status: IUD).  Negative for Dysmenorrhea and Negative for Menorrhagia. Negative for: breast discharge, breast lump(s), breast pain and breast self exam. Associated symptoms include abnormal vaginal bleeding. Pertinent negatives include abnormal bleeding (hematology), anxiety, decreased libido, depression, difficulty falling sleep, dyspareunia, history of infertility, nocturia, sexual dysfunction, sleep disturbances, urinary incontinence, urinary urgency, vaginal discharge and vaginal itching. Diet regular; eats healthy sometimes and sometimes eats regular. She tries to drink 5-7 - 16 oz bottle of water a day The patient states her exercise level is vigorous.   The patient's tobacco use is:  Social History   Tobacco Use  Smoking Status Never  Smokeless Tobacco Never   She has been exposed to passive smoke. The patient's alcohol use is:  Social History   Substance and Sexual Activity  Alcohol Use Yes   Comment: social  Additional information: Last pap 10/17/2021, next one scheduled for 10/17/2024.    Review of Systems  Constitutional: Negative.   HENT: Negative.    Eyes: Negative.   Respiratory: Negative.  Cardiovascular: Negative.  Negative for chest pain, palpitations and leg swelling.  Gastrointestinal: Negative.   Endocrine: Negative.   Genitourinary: Negative.   Musculoskeletal: Negative.   Skin: Negative.   Allergic/Immunologic: Negative.   Neurological: Negative.   Hematological: Negative.   Psychiatric/Behavioral: Negative.       Today's Vitals   02/17/24 1444  BP: 118/80  Pulse: 95  Temp: 98.4 F (36.9 C)  TempSrc: Oral  Weight: 202 lb 12.8 oz (92 kg)  Height: 5\' 5"  (1.651 m)  PainSc: 0-No pain   Body mass index is  33.75 kg/m.  Wt Readings from Last 3 Encounters:  02/17/24 202 lb 12.8 oz (92 kg)  10/21/23 204 lb 9.6 oz (92.8 kg)  01/16/23 191 lb 12.8 oz (87 kg)     Objective:  Physical Exam Vitals and nursing note reviewed.  Constitutional:      General: She is not in acute distress.    Appearance: Normal appearance. She is well-developed. She is obese.  HENT:     Head: Normocephalic and atraumatic.     Right Ear: Hearing, tympanic membrane, ear canal and external ear normal. There is no impacted cerumen.     Left Ear: Hearing, tympanic membrane, ear canal and external ear normal. There is no impacted cerumen.     Nose:     Comments: Deferred - masked    Mouth/Throat:     Comments: Deferred - masked Eyes:     General: Lids are normal.     Extraocular Movements: Extraocular movements intact.     Conjunctiva/sclera: Conjunctivae normal.     Pupils: Pupils are equal, round, and reactive to light.     Funduscopic exam:    Right eye: No papilledema.        Left eye: No papilledema.  Neck:     Thyroid: No thyroid mass.     Vascular: No carotid bruit.  Cardiovascular:     Rate and Rhythm: Normal rate and regular rhythm.     Pulses: Normal pulses.     Heart sounds: Normal heart sounds. No murmur heard. Pulmonary:     Effort: Pulmonary effort is normal. No respiratory distress.     Breath sounds: Normal breath sounds. No wheezing.  Chest:     Chest wall: No mass.  Breasts:    Tanner Score is 5.     Right: Normal. No mass or tenderness.     Left: Mass (lump palpated to left outer breast at 5 oclock) present. No tenderness.  Abdominal:     General: Abdomen is flat. Bowel sounds are normal. There is no distension.     Palpations: Abdomen is soft.     Tenderness: There is no abdominal tenderness.  Genitourinary:    Rectum: Guaiac result negative.  Musculoskeletal:        General: No swelling. Normal range of motion.     Cervical back: Full passive range of motion without pain, normal  range of motion and neck supple.     Right lower leg: No edema.     Left lower leg: No edema.  Lymphadenopathy:     Upper Body:     Right upper body: No supraclavicular, axillary or pectoral adenopathy.     Left upper body: No supraclavicular, axillary or pectoral adenopathy.  Skin:    General: Skin is warm and dry.     Capillary Refill: Capillary refill takes less than 2 seconds.  Neurological:     General: No focal deficit present.  Mental Status: She is alert and oriented to person, place, and time.     Cranial Nerves: No cranial nerve deficit.     Sensory: No sensory deficit.     Motor: No weakness.  Psychiatric:        Mood and Affect: Mood normal.        Behavior: Behavior normal.        Thought Content: Thought content normal.        Judgment: Judgment normal.          Assessment And Plan:     Encounter for annual health examination Assessment & Plan: Behavior modifications discussed and diet history reviewed.   Pt will continue to exercise regularly and modify diet with low GI, plant based foods and decrease intake of processed foods.  Recommend intake of daily multivitamin, Vitamin D, and calcium.  Recommend mammogram  for preventive screenings, as well as recommend immunizations that include influenza, TDAP    Essential hypertension Assessment & Plan: Blood pressure is controlled, continue current medications  Orders: -     EKG 12-Lead -     POCT URINALYSIS DIP (CLINITEK) -     Microalbumin / creatinine urine ratio -     CMP14+EGFR  Vitamin D deficiency Assessment & Plan: Will check vitamin D level and supplement as needed.    Also encouraged to spend 15 minutes in the sun daily.    Orders: -     VITAMIN D 25 Hydroxy (Vit-D Deficiency, Fractures)  Class 1 obesity with body mass index (BMI) of 33.0 to 33.9 in adult, unspecified obesity type, unspecified whether serious comorbidity present Assessment & Plan: Weight slightly increased however  continues to exercise regularly.    Other chest pain Assessment & Plan: EKG done with NSR HR 63, pain with mild movement, no radiation down arm or neck  Orders: -     CK  Cutaneous skin tags -     Ambulatory referral to Dermatology  Other long term (current) drug therapy -     CBC with Differential/Platelet     Return for 1 year physical, 6 month bp check. Patient was given opportunity to ask questions. Patient verbalized understanding of the plan and was able to repeat key elements of the plan. All questions were answered to their satisfaction.   Susanna Epley, FNP  I, Susanna Epley, FNP, have reviewed all documentation for this visit. The documentation on 02/17/24 for the exam, diagnosis, procedures, and orders are all accurate and complete.

## 2024-02-18 LAB — CMP14+EGFR
ALT: 21 IU/L (ref 0–32)
AST: 30 IU/L (ref 0–40)
Albumin: 4.7 g/dL (ref 3.9–4.9)
Alkaline Phosphatase: 64 IU/L (ref 44–121)
BUN/Creatinine Ratio: 11 (ref 9–23)
BUN: 14 mg/dL (ref 6–24)
Bilirubin Total: 0.4 mg/dL (ref 0.0–1.2)
CO2: 24 mmol/L (ref 20–29)
Calcium: 9.8 mg/dL (ref 8.7–10.2)
Chloride: 100 mmol/L (ref 96–106)
Creatinine, Ser: 1.24 mg/dL — ABNORMAL HIGH (ref 0.57–1.00)
Globulin, Total: 2.7 g/dL (ref 1.5–4.5)
Glucose: 79 mg/dL (ref 70–99)
Potassium: 3.7 mmol/L (ref 3.5–5.2)
Sodium: 138 mmol/L (ref 134–144)
Total Protein: 7.4 g/dL (ref 6.0–8.5)
eGFR: 56 mL/min/{1.73_m2} — ABNORMAL LOW (ref >59)

## 2024-02-18 LAB — CBC WITH DIFFERENTIAL/PLATELET
Basophils Absolute: 0.1 10*3/uL (ref 0.0–0.2)
Basos: 1 %
EOS (ABSOLUTE): 0.1 10*3/uL (ref 0.0–0.4)
Eos: 2 %
Hematocrit: 40.4 % (ref 34.0–46.6)
Hemoglobin: 13.4 g/dL (ref 11.1–15.9)
Immature Grans (Abs): 0 10*3/uL (ref 0.0–0.1)
Immature Granulocytes: 0 %
Lymphocytes Absolute: 2.3 10*3/uL (ref 0.7–3.1)
Lymphs: 34 %
MCH: 30.7 pg (ref 26.6–33.0)
MCHC: 33.2 g/dL (ref 31.5–35.7)
MCV: 92 fL (ref 79–97)
Monocytes Absolute: 0.4 10*3/uL (ref 0.1–0.9)
Monocytes: 6 %
Neutrophils Absolute: 4 10*3/uL (ref 1.4–7.0)
Neutrophils: 57 %
Platelets: 391 10*3/uL (ref 150–450)
RBC: 4.37 x10E6/uL (ref 3.77–5.28)
RDW: 12.1 % (ref 11.7–15.4)
WBC: 6.8 10*3/uL (ref 3.4–10.8)

## 2024-02-18 LAB — MICROALBUMIN / CREATININE URINE RATIO
Creatinine, Urine: 101.2 mg/dL
Microalb/Creat Ratio: <3 mg/g{creat} (ref 0–29)
Microalbumin, Urine: <3 ug/mL

## 2024-02-18 LAB — VITAMIN D 25 HYDROXY (VIT D DEFICIENCY, FRACTURES): Vit D, 25-Hydroxy: 26.5 ng/mL — ABNORMAL LOW (ref 30.0–100.0)

## 2024-02-18 LAB — CK: Total CK: 371 U/L — ABNORMAL HIGH (ref 32–182)

## 2024-02-24 ENCOUNTER — Other Ambulatory Visit: Payer: Self-pay | Admitting: Nurse Practitioner

## 2024-02-24 ENCOUNTER — Encounter: Payer: Self-pay | Admitting: Nurse Practitioner

## 2024-02-24 DIAGNOSIS — E66811 Obesity, class 1: Secondary | ICD-10-CM | POA: Insufficient documentation

## 2024-02-24 DIAGNOSIS — L918 Other hypertrophic disorders of the skin: Secondary | ICD-10-CM | POA: Insufficient documentation

## 2024-02-24 DIAGNOSIS — R748 Abnormal levels of other serum enzymes: Secondary | ICD-10-CM

## 2024-02-24 DIAGNOSIS — Z6833 Body mass index (BMI) 33.0-33.9, adult: Secondary | ICD-10-CM | POA: Insufficient documentation

## 2024-02-24 DIAGNOSIS — R0789 Other chest pain: Secondary | ICD-10-CM

## 2024-02-24 DIAGNOSIS — Z0000 Encounter for general adult medical examination without abnormal findings: Secondary | ICD-10-CM | POA: Insufficient documentation

## 2024-02-24 NOTE — Assessment & Plan Note (Signed)
 Blood pressure is controlled, continue current medications

## 2024-02-24 NOTE — Assessment & Plan Note (Signed)
 Behavior modifications discussed and diet history reviewed.   Pt will continue to exercise regularly and modify diet with low GI, plant based foods and decrease intake of processed foods.  Recommend intake of daily multivitamin, Vitamin D, and calcium.  Recommend mammogram for preventive screenings, as well as recommend immunizations that include influenza, TDAP

## 2024-02-24 NOTE — Assessment & Plan Note (Addendum)
 EKG done with NSR HR 63, pain with mild movement, no radiation down arm or neck

## 2024-02-24 NOTE — Assessment & Plan Note (Signed)
 Weight slightly increased however continues to exercise regularly.

## 2024-02-24 NOTE — Assessment & Plan Note (Signed)
 Will check vitamin D level and supplement as needed.    Also encouraged to spend 15 minutes in the sun daily.

## 2024-03-07 ENCOUNTER — Other Ambulatory Visit: Payer: Self-pay | Admitting: Nurse Practitioner

## 2024-03-08 ENCOUNTER — Encounter: Payer: Self-pay | Admitting: Nurse Practitioner

## 2024-03-08 ENCOUNTER — Other Ambulatory Visit: Payer: Self-pay

## 2024-03-08 MED ORDER — OMEPRAZOLE 40 MG PO CPDR: 40.0000 mg | DELAYED_RELEASE_CAPSULE | Freq: Every day | ORAL | 1 refills | Status: AC

## 2024-04-06 ENCOUNTER — Other Ambulatory Visit: Payer: Self-pay

## 2024-04-06 MED ORDER — AMLODIPINE BESYLATE 10 MG PO TABS: 10.0000 mg | ORAL_TABLET | Freq: Every day | ORAL | 3 refills | Status: AC

## 2024-04-13 ENCOUNTER — Other Ambulatory Visit: Payer: Self-pay

## 2024-08-18 ENCOUNTER — Ambulatory Visit: Admitting: Nurse Practitioner

## 2024-08-18 ENCOUNTER — Encounter: Payer: Self-pay | Admitting: Nurse Practitioner

## 2024-08-18 VITALS — BP 110/70 | HR 69 | Temp 98.7°F | Ht 65.0 in | Wt 206.0 lb

## 2024-08-18 DIAGNOSIS — Z139 Encounter for screening, unspecified: Secondary | ICD-10-CM

## 2024-08-18 DIAGNOSIS — I10 Essential (primary) hypertension: Secondary | ICD-10-CM

## 2024-08-18 DIAGNOSIS — Z6834 Body mass index (BMI) 34.0-34.9, adult: Secondary | ICD-10-CM | POA: Diagnosis not present

## 2024-08-18 DIAGNOSIS — E6609 Other obesity due to excess calories: Secondary | ICD-10-CM | POA: Diagnosis not present

## 2024-08-18 DIAGNOSIS — E66811 Obesity, class 1: Secondary | ICD-10-CM

## 2024-08-18 DIAGNOSIS — Z2821 Immunization not carried out because of patient refusal: Secondary | ICD-10-CM

## 2024-08-18 NOTE — Progress Notes (Signed)
 LILLETTE Kristeen JINNY Gladis, CMA,acting as a Neurosurgeon for Olivia Ada, FNP.,have documented all relevant documentation on the behalf of Olivia Ada, FNP,as directed by  Olivia Ada, FNP while in the presence of Olivia Ada, FNP.  Subjective:  Patient ID: [name] , female    DOB: April 28, 1981 , 43 y.o.   MRN: [id number]  Chief Complaint  Patient presents with   Hypertension    Patient presents today for a bp follow up, Patient reports compliance with medication. Patient denies any chest pain, SOB, or headaches.    Obesity    Patient would like to discuss weight loss options, patient reports she eats well. Patient reports she exercise about 5 days a week but has a hard time losing weight.      HPI  Discussed the use of AI scribe software for clinical note transcription with the patient, who gave verbal consent to proceed.  History of Present Illness [name] is a 43 year old female with hypertension who presents for a follow-up on her blood pressure management.  Her blood pressure has been stable with recent readings around 110/70 mmHg. She continues to take amlodipine  10 mg daily.  She is exploring weight loss options but faces financial constraints as her state health plan does not cover weight loss treatments. She has tried calorie counting and exercise but finds it challenging to maintain weight loss, especially since turning 41. She feels the need to be extremely restrictive with her diet to see results, which is unsustainable for her.  Her diet is low in carbohydrates, primarily consisting of fruits like apples, bananas, clementines, strawberries, and grapes. She occasionally uses low-carb wraps for meals like tacos. Despite these efforts, she feels desperate as her current regimen is not yielding the desired results.  She exercises regularly, incorporating high-intensity interval training (HIIT), weights, and cardio into her routine. Despite her activity level, her weight has  not decreased as expected. She has experienced weight fluctuations in the past and finds it increasingly difficult to lose weight as she ages.  She experiences stress related to her job and her daughter's senior year in high school, which may be affecting her sleep and weight management. She acknowledges not getting enough sleep, which she understands can contribute to weight gain.   Past Medical History:  Diagnosis Date   Gestational hypertension      Family History  Problem Relation Age of Onset   Hypertension Mother    Hypertension Father    Other Neg Hx    Migraines Neg Hx    Headache Neg Hx      Current Outpatient Medications:    albuterol  (VENTOLIN  HFA) 108 (90 Base) MCG/ACT inhaler, Inhale 2 puffs into the lungs every 6 (six) hours as needed for wheezing or shortness of breath., Disp: 8 g, Rfl: 2   amLODipine  (NORVASC ) 10 MG tablet, Take 1 tablet (10 mg total) by mouth daily., Disp: 90 tablet, Rfl: 3   azelastine  (ASTELIN ) 0.1 % nasal spray, Place 2 sprays into both nostrils 2 (two) times daily. Use in each nostril as directed, Disp: 30 mL, Rfl: 12   cetirizine  (ZYRTEC ) 10 MG tablet, Take 1 tablet (10 mg total) by mouth daily., Disp: 90 tablet, Rfl: 1   fluticasone  (FLONASE ) 50 MCG/ACT nasal spray, Place 2 sprays into both nostrils daily., Disp: 16 g, Rfl: 0   omeprazole  (PRILOSEC) 40 MG capsule, Take 1 capsule (40 mg total) by mouth daily., Disp: 90 capsule, Rfl: 1   Allergies  Allergen Reactions   Diflucan [Fluconazole] Other (See Comments)    Skin on her hand burned off.     Review of Systems  Constitutional: Negative.   Respiratory: Negative.    Cardiovascular: Negative.  Negative for chest pain and palpitations.  Gastrointestinal: Negative.   Neurological:  Negative for headaches.  Psychiatric/Behavioral: Negative.       Today's Vitals   08/18/24 0902  BP: 110/70  Pulse: 69  Temp: 98.7 F (37.1 C)  TempSrc: Oral  Weight: 206 lb (93.4 kg)  Height: 5' 5  (1.651 m)  PainSc: 0-No pain   Body mass index is 34.28 kg/m.  Wt Readings from Last 3 Encounters:  08/18/24 206 lb (93.4 kg)  02/17/24 202 lb 12.8 oz (92 kg)  10/21/23 204 lb 9.6 oz (92.8 kg)     Objective:  Physical Exam Vitals and nursing note reviewed.  Constitutional:      General: She is not in acute distress.    Appearance: Normal appearance.  Cardiovascular:     Pulses: Normal pulses.     Heart sounds: Normal heart sounds. No murmur heard. Pulmonary:     Effort: Pulmonary effort is normal. No respiratory distress.     Breath sounds: Normal breath sounds. No wheezing.  Skin:    General: Skin is warm and dry.     Capillary Refill: Capillary refill takes less than 2 seconds.  Neurological:     General: No focal deficit present.     Mental Status: She is alert and oriented to person, place, and time.     Cranial Nerves: No cranial nerve deficit.     Sensory: Sensation is intact.     Gait: Gait is intact.  Psychiatric:        Mood and Affect: Mood normal.        Behavior: Behavior normal.        Thought Content: Thought content normal.        Judgment: Judgment normal.       Assessment And Plan:  Essential hypertension Assessment & Plan: Blood pressure well-controlled at 110/70 mmHg. - Continue amlodipine  10 mg oral daily.  Orders: -     BMP8+eGFR  Influenza vaccination declined  Class 1 obesity due to excess calories with serious comorbidity and body mass index (BMI) of 34.0 to 34.9 in adult Assessment & Plan: Explored weight loss options, including medications and lifestyle changes. Insurance does not cover medications, but cash pay options are available. A1c normal at 5.4 in 2022, ruling out insulin  resistance. - Refer to [facility] for evaluation and support, covered by insurance. - Consider cash pay options for weight loss medications such as Qsymia and Contrave, discussing side effects. - Implement lifestyle modifications such as a  low-carb diet and intermittent fasting.  Orders: -     Amb Ref to [facility]  Encounter for screening -     Hepatitis B surface antibody,qualitative     Return for keep same next.  Patient was given opportunity to ask questions. Patient verbalized understanding of the plan and was able to repeat key elements of the plan. All questions were answered to their satisfaction.    LILLETTE Olivia Ada, FNP, have reviewed all documentation for this visit. The documentation on 08/18/24 for the exam, diagnosis, procedures, and orders are all accurate and complete.   IF YOU HAVE BEEN REFERRED TO A SPECIALIST, IT MAY TAKE 1-2 WEEKS TO SCHEDULE/PROCESS THE REFERRAL. IF YOU HAVE NOT HEARD FROM US /SPECIALIST  IN TWO WEEKS, PLEASE GIVE US  A CALL AT [phone].

## 2024-08-19 ENCOUNTER — Encounter (INDEPENDENT_AMBULATORY_CARE_PROVIDER_SITE_OTHER): Payer: Self-pay

## 2024-08-29 NOTE — Assessment & Plan Note (Signed)
 Blood pressure well-controlled at 110/70 mmHg. - Continue amlodipine  10 mg oral daily.

## 2024-08-29 NOTE — Assessment & Plan Note (Signed)
 Explored weight loss options, including medications and lifestyle changes. Insurance does not cover medications, but cash pay options are available. A1c normal at 5.4 in 2022, ruling out insulin  resistance. - Refer to [facility] for evaluation and support, covered by insurance. - Consider cash pay options for weight loss medications such as Qsymia and Contrave, discussing side effects. - Implement lifestyle modifications such as a low-carb diet and intermittent fasting.

## 2024-09-07 ENCOUNTER — Institutional Professional Consult (permissible substitution) (INDEPENDENT_AMBULATORY_CARE_PROVIDER_SITE_OTHER): Admitting: Physician Assistant

## 2024-10-21 ENCOUNTER — Encounter: Payer: Self-pay | Admitting: Dermatology

## 2024-10-21 ENCOUNTER — Ambulatory Visit: Payer: Self-pay | Admitting: Dermatology

## 2024-10-21 VITALS — BP 139/95 | HR 75

## 2024-10-21 DIAGNOSIS — Z79899 Other long term (current) drug therapy: Secondary | ICD-10-CM | POA: Diagnosis not present

## 2024-10-21 DIAGNOSIS — L649 Androgenic alopecia, unspecified: Secondary | ICD-10-CM

## 2024-10-21 DIAGNOSIS — L918 Other hypertrophic disorders of the skin: Secondary | ICD-10-CM

## 2024-10-21 MED ORDER — SAFETY SEAL MISCELLANEOUS MISC: 1.0000 | Freq: Every morning | 10 refills | Status: AC

## 2024-10-21 NOTE — Progress Notes (Signed)
 "  New Patient Visit   Subjective  [name] is a 43 y.o. female who presents for the following: Skin Tags and Hair Loss  Patient states she has Skin Tags located at the B/L Axilla & Inner thighs that she would like to have examined. Patient reports the areas have been there for 1 year. She reports the areas are not bothersome. She states that the areas have spread. Patient reports she has not previously been treated for these areas.   Patient states she has hair loss located at the scalp that she would like to have examined. Patient reports the areas have been there for 1 year. She reports the areas are not bothersome. Patient reports she has not previously been treated for these areas.   Patient reports she is not actively pregnant,trying to conceive or nursing.   Patient provided verbal consent for the use of an AI-assisted program to generate a detailed after-visit summary. The patient understands that the AI tool is used to support clinical documentation and that all information will be reviewed and verified by the healthcare provider.  The following portions of the chart were reviewed this encounter and updated as appropriate: medications, allergies, medical history  Review of Systems:  No other skin or systemic complaints except as noted in HPI or Assessment and Plan.  Objective  Well appearing patient in no apparent distress; mood and affect are within normal limits.  A focused examination was performed of the following areas: B/L Axillae, inner thighs & Scalp  Relevant exam findings are noted in the Assessment and Plan.   Assessment & Plan   Acrochordons (Skin Tags) (Total of 7) - Fleshy, skin-colored pedunculated papules - Benign appearing.  - Observe. - If desired, they can be removed with an in office procedure that is not covered by insurance. - Please call the clinic if you notice any new or changing lesions.  Plan: Cosmetic Quote The patient received the  following cosmetic quote  Other Procedure(Skin Tags): Quote for cosmetic removal:  $200 to remove on 1-15 $100 Deposit Required $300 to remove on 16-30 $150 Deposit Required  ANDROGENETIC ALOPECIA (FEMALE PATTERN HAIR LOSS) Exam: Diffuse thinning of the crown and widening of the midline part with retention of the frontal hairline  Not at goal  Female Androgenic Alopecia is a chronic condition related to genetics and/or hormonal changes.  In women androgenetic alopecia is commonly associated with menopause but may occur any time after puberty.  It causes hair thinning primarily on the crown with widening of the part and temporal hairline recession.  Hair thinning primarily at the temples and part line, consistent with androgenetic alopecia. No scarring observed. Likely related to hormonal changes associated with aging and potential testosterone sensitivity. Early signs of androgenetic alopecia typically occur between ages 102 and 18. Minoxidil and finasteride are effective in regrowing hair and preventing further thinning, but require continuous use. Initial shedding may occur within the first month or two of treatment, but regrowth is expected within 3 to 6 months. Supplements like Nutrafol may aid in hair growth but are not a substitute for minoxidil and finasteride.  - Prescribed compounded minoxidil and finasteride solution for topical application. - Instructed to apply the solution in the morning to avoid unwanted hair growth on the face. - Advised to continue treatment indefinitely to maintain hair regrowth and prevent further thinning. - Discussed potential initial shedding phase and reassured that regrowth is expected. - Scheduled follow-up in 6 months to assess progress  and address any concerns.   Treatment Plan: - Prescribed Hormonic Hair Solution to [facility]  - Recommended collagen Supplements in combination with Vivascal or Nutrafol - Plan to follow up in 6 Months  Long  term medication management.  Patient is using long term (months to years) prescription medication  to control their dermatologic condition.  These medications require periodic monitoring to evaluate for efficacy and side effects and may require periodic laboratory monitoring.   ANDROGENIC ALOPECIA   This Visit - Safety Seal Miscellaneous MISC - Apply 1 Application topically in the morning. Medication Name: Hormonic Hair Solution (Minoxidil 8%, Finasteride 0.05%)  Return in about 6 months (around 04/21/2025) for Androgentic Alopecia F/U.  I, Jetta Ager, am acting as neurosurgeon for [employer], DO.  Documentation: I have reviewed the above documentation for accuracy and completeness, and I agree with the above.  Delon Lenis, DO     "

## 2024-10-21 NOTE — Patient Instructions (Addendum)
 VISIT SUMMARY:  During your visit, we discussed your concerns about hair loss and skin tags. We reviewed your symptoms, medical history, and potential contributing factors. A treatment plan was established for both issues, and follow-up instructions were provided.  YOUR PLAN:  -ANDROGENETIC ALOPECIA:  Androgenetic alopecia is a common form of hair loss related to hormonal changes and sensitivity to testosterone.   You have been prescribed a compounded minoxidil and finasteride solution to apply topically in the morning.   This treatment may cause initial shedding, but regrowth is expected within 3 to 6 months. Continuous use is necessary to maintain results.   We will follow up in 6 months to assess your progress.  -SKIN TAGS:  Skin tags are small, benign growths that can be associated with very early insulin  resistance and weight gain. Removal is a cosmetic procedure involving numbing, snipping, and cauterizing the base.   You were provided with a quote for removal and instructed to schedule a procedure visit with a $100 deposit. We also discussed the importance of monitoring your weight and insulin  levels to prevent new skin tags from forming.  INSTRUCTIONS:  Please schedule a follow-up appointment in 6 months to assess your progress with the hair loss treatment. Additionally, schedule a procedure visit for skin tag removal with a $100 deposit. Continue to monitor your weight and insulin  levels as discussed.          Plan: Cosmetic Quote The patient received the following cosmetic quote  Other Procedure(Skin Tags): Quote for cosmetic removal:  $200 to remove on 1-15 $100 Deposit Required $300 to remove on 16-30 $150 Deposit Required  Your provider has sent your prescription to [facility] in [place], [place] . A pharmacy representative will call you to confirm details and take your payment information. If you do not receive a call within 24 hours, please contact the  pharmacy at [phone] or [phone]. Your unique skincare compound is being formulated in our lab (most compounds take less than 24 hours). Your prescription is shipped vis USPS to your mailbox (2-4 business days). Priority shipping is available at an additional cost. Once received, you will electronically sign/acknowledge that you received your prescription. The pharmacy hours are Monday-Friday 9 am-6 pm EST and Saturday 9 am-1 pm EST.     Important Information   Due to recent changes in healthcare laws, you may see results of your pathology and/or laboratory studies on MyChart before the doctors have had a chance to review them. We understand that in some cases there may be results that are confusing or concerning to you. Please understand that not all results are received at the same time and often the doctors may need to interpret multiple results in order to provide you with the best plan of care or course of treatment. Therefore, we ask that you please give us  2 business days to thoroughly review all your results before contacting the office for clarification. Should we see a critical lab result, you will be contacted sooner.     If You Need Anything After Your Visit   If you have any questions or concerns for your doctor, please call our main line at [phone]. If no one answers, please leave a voicemail as directed and we will return your call as soon as possible. Messages left after 4 pm will be answered the following business day.    You may also send us  a message via MyChart. We typically respond to MyChart messages within 1-2 business days.  For  prescription refills, please ask your pharmacy to contact our office. Our fax number is [phone].  If you have an urgent issue when the clinic is closed that cannot wait until the next business day, you can page your doctor at the number below.     Please note that while we do our best to be available for urgent issues outside of  office hours, we are not available 24/7.    If you have an urgent issue and are unable to reach us , you may choose to seek medical care at your doctor's office, retail clinic, urgent care center, or emergency room.   If you have a medical emergency, please immediately call 911 or go to the emergency department. In the event of inclement weather, please call our main line at [phone] for an update on the status of any delays or closures.  Dermatology Medication Tips: Please keep the boxes that topical medications come in in order to help keep track of the instructions about where and how to use these. Pharmacies typically print the medication instructions only on the boxes and not directly on the medication tubes.   If your medication is too expensive, please contact our office at [phone] or send us  a message through MyChart.    We are unable to tell what your co-pay for medications will be in advance as this is different depending on your insurance coverage. However, we may be able to find a substitute medication at lower cost or fill out paperwork to get insurance to cover a needed medication.    If a prior authorization is required to get your medication covered by your insurance company, please allow us  1-2 business days to complete this process.   Drug prices often vary depending on where the prescription is filled and some pharmacies may offer cheaper prices.   The website www.goodrx.com contains coupons for medications through different pharmacies. The prices here do not account for what the cost may be with help from insurance (it may be cheaper with your insurance), but the website can give you the price if you did not use any insurance.  - You can print the associated coupon and take it with your prescription to the pharmacy.  - You may also stop by our office during regular business hours and pick up a GoodRx coupon card.  - If you need your prescription sent electronically to  a different pharmacy, notify our office through [facility] or by phone at [phone]

## 2025-02-21 ENCOUNTER — Encounter: Payer: Self-pay | Admitting: Nurse Practitioner
